# Patient Record
Sex: Female | Born: 1965 | Race: Black or African American | Hispanic: No | Marital: Married | Smoking: Former smoker
Health system: Southern US, Community
[De-identification: ages and names within clinical notes are randomized; demographics above are authoritative.]

## PROBLEM LIST (undated history)

## (undated) DIAGNOSIS — E559 Vitamin D deficiency, unspecified: Secondary | ICD-10-CM

## (undated) DIAGNOSIS — D126 Benign neoplasm of colon, unspecified: Secondary | ICD-10-CM

## (undated) DIAGNOSIS — F4310 Post-traumatic stress disorder, unspecified: Secondary | ICD-10-CM

## (undated) DIAGNOSIS — M1990 Unspecified osteoarthritis, unspecified site: Secondary | ICD-10-CM

## (undated) DIAGNOSIS — K219 Gastro-esophageal reflux disease without esophagitis: Secondary | ICD-10-CM

## (undated) DIAGNOSIS — IMO0002 Reserved for concepts with insufficient information to code with codable children: Secondary | ICD-10-CM

## (undated) HISTORY — PX: DEEP NECK LYMPH NODE BIOPSY / EXCISION: SUR126

## (undated) HISTORY — DX: Reserved for concepts with insufficient information to code with codable children: IMO0002

## (undated) HISTORY — DX: Unspecified osteoarthritis, unspecified site: M19.90

## (undated) HISTORY — DX: Gastro-esophageal reflux disease without esophagitis: K21.9

## (undated) HISTORY — PX: TONSILLECTOMY: SUR1361

## (undated) HISTORY — PX: COLONOSCOPY: SHX174

## (undated) HISTORY — DX: Benign neoplasm of colon, unspecified: D12.6

## (undated) HISTORY — PX: FOOT SURGERY: SHX648

## (undated) HISTORY — DX: Vitamin D deficiency, unspecified: E55.9

---

## 1998-08-27 ENCOUNTER — Other Ambulatory Visit: Admission: RE | Admit: 1998-08-27 | Discharge: 1998-08-27 | Payer: Self-pay | Admitting: Obstetrics and Gynecology

## 1999-03-17 ENCOUNTER — Inpatient Hospital Stay (HOSPITAL_COMMUNITY): Admission: AD | Admit: 1999-03-17 | Discharge: 1999-03-19 | Payer: Self-pay | Admitting: *Deleted

## 1999-03-22 ENCOUNTER — Encounter: Admission: RE | Admit: 1999-03-22 | Discharge: 1999-06-20 | Payer: Self-pay | Admitting: Obstetrics & Gynecology

## 1999-08-12 ENCOUNTER — Other Ambulatory Visit: Admission: RE | Admit: 1999-08-12 | Discharge: 1999-08-12 | Payer: Self-pay | Admitting: *Deleted

## 2000-04-30 ENCOUNTER — Other Ambulatory Visit: Admission: RE | Admit: 2000-04-30 | Discharge: 2000-04-30 | Payer: Self-pay | Admitting: Obstetrics and Gynecology

## 2000-07-08 ENCOUNTER — Other Ambulatory Visit: Admission: RE | Admit: 2000-07-08 | Discharge: 2000-07-08 | Payer: Self-pay | Admitting: Obstetrics and Gynecology

## 2001-07-28 ENCOUNTER — Other Ambulatory Visit: Admission: RE | Admit: 2001-07-28 | Discharge: 2001-07-28 | Payer: Self-pay | Admitting: Obstetrics and Gynecology

## 2002-08-01 ENCOUNTER — Ambulatory Visit (HOSPITAL_COMMUNITY): Admission: RE | Admit: 2002-08-01 | Discharge: 2002-08-01 | Payer: Self-pay | Admitting: Obstetrics and Gynecology

## 2002-08-01 ENCOUNTER — Encounter: Payer: Self-pay | Admitting: Obstetrics and Gynecology

## 2002-08-02 ENCOUNTER — Other Ambulatory Visit: Admission: RE | Admit: 2002-08-02 | Discharge: 2002-08-02 | Payer: Self-pay | Admitting: Obstetrics and Gynecology

## 2003-01-10 ENCOUNTER — Inpatient Hospital Stay (HOSPITAL_COMMUNITY): Admission: AD | Admit: 2003-01-10 | Discharge: 2003-01-12 | Payer: Self-pay | Admitting: Obstetrics and Gynecology

## 2003-09-05 ENCOUNTER — Other Ambulatory Visit: Admission: RE | Admit: 2003-09-05 | Discharge: 2003-09-05 | Payer: Self-pay | Admitting: Obstetrics and Gynecology

## 2004-09-10 ENCOUNTER — Other Ambulatory Visit: Admission: RE | Admit: 2004-09-10 | Discharge: 2004-09-10 | Payer: Self-pay | Admitting: Obstetrics and Gynecology

## 2005-09-18 ENCOUNTER — Other Ambulatory Visit: Admission: RE | Admit: 2005-09-18 | Discharge: 2005-09-18 | Payer: Self-pay | Admitting: Obstetrics and Gynecology

## 2006-06-19 ENCOUNTER — Emergency Department (HOSPITAL_COMMUNITY): Admission: EM | Admit: 2006-06-19 | Discharge: 2006-06-19 | Payer: Self-pay | Admitting: Emergency Medicine

## 2006-12-10 ENCOUNTER — Other Ambulatory Visit: Admission: RE | Admit: 2006-12-10 | Discharge: 2006-12-10 | Payer: Self-pay | Admitting: Otolaryngology

## 2006-12-23 ENCOUNTER — Other Ambulatory Visit: Admission: RE | Admit: 2006-12-23 | Discharge: 2006-12-23 | Payer: Self-pay | Admitting: Otolaryngology

## 2007-04-27 ENCOUNTER — Encounter: Admission: RE | Admit: 2007-04-27 | Discharge: 2007-04-27 | Payer: Self-pay | Admitting: Obstetrics and Gynecology

## 2007-05-11 ENCOUNTER — Encounter: Admission: RE | Admit: 2007-05-11 | Discharge: 2007-05-11 | Payer: Self-pay | Admitting: Obstetrics and Gynecology

## 2008-05-16 ENCOUNTER — Encounter: Admission: RE | Admit: 2008-05-16 | Discharge: 2008-05-16 | Payer: Self-pay | Admitting: Family Medicine

## 2008-10-19 ENCOUNTER — Encounter: Admission: RE | Admit: 2008-10-19 | Discharge: 2008-10-19 | Payer: Self-pay | Admitting: Obstetrics and Gynecology

## 2009-09-04 ENCOUNTER — Encounter: Admission: RE | Admit: 2009-09-04 | Discharge: 2009-09-04 | Payer: Self-pay | Admitting: Gastroenterology

## 2009-10-22 ENCOUNTER — Encounter: Admission: RE | Admit: 2009-10-22 | Discharge: 2009-10-22 | Payer: Self-pay | Admitting: Family Medicine

## 2010-05-05 DIAGNOSIS — IMO0002 Reserved for concepts with insufficient information to code with codable children: Secondary | ICD-10-CM

## 2010-05-05 HISTORY — PX: ESOPHAGOGASTRODUODENOSCOPY: SHX1529

## 2010-05-05 HISTORY — DX: Reserved for concepts with insufficient information to code with codable children: IMO0002

## 2010-09-20 NOTE — H&P (Signed)
[name], [name] NO.:  [id number]   MEDICAL RECORD NO.:  [id number]                   PATIENT TYPE:  INP   LOCATION:  9162                                 FACILITY:  [facility]   PHYSICIAN:  Osborn Coho, M.D.                DATE OF BIRTH:  1966-02-15   DATE OF ADMISSION:  01/10/2003  DATE OF DISCHARGE:                                HISTORY & PHYSICAL   HISTORY OF PRESENT ILLNESS:  [name] is a 45 year old married black female  gravida 5 para 1-0-3-1 at 61 and two-sevenths weeks who presents complaining  of uterine contractions every eight to ten minutes and reports decreased  fetal movement over the last day.  She denies any leaking or vaginal  bleeding.  She denies any nausea, vomiting, headaches, or visual  disturbances.  Her pregnancy has been followed at [facility] by  the certified nurse midwife service and has been essentially uncomplicated  though at risk originally for:  1. Partial previa now resolved.  2. Advanced maternal age with no amniocentesis.  3. Having cats at her home.  4. Multiple elective ABs.   Her group B strep is negative with this pregnancy.   OBSTETRICAL AND GYNECOLOGICAL HISTORY:  She is a gravida 5 para 1-0-3-1 who  had elective ABs in 1985, 1987, and 1992, and then delivered a viable female  infant in November 2000 that weighed 7 pounds 14 ounces at [redacted] weeks  gestation following a 17 hour labor vaginally with Stadol for pain medicine.  She did require Pitocin augmentation.  That child was named Martha [name] and was  delivered by Sherald Barge, C.N.M.  Other GYN history is noncontributory.   ALLERGIES:  No known drug allergies.   GENERAL MEDICAL HISTORY:  She reports having had the usual childhood  diseases.  She has no other medical issues other than occasional migraines  and that she smoked years ago but stopped in 1991.  Her only surgeries were  the elective ABs and on her feet in 1996 and 1999.  Only  other  hospitalization has been for childbirth.   GENETIC HISTORY:  Significant for the fact that she is age 29 and that she  has distant cousin with muscular dystrophy, and the father-of-the-baby's  mother had twins.   SOCIAL HISTORY:  She is married to Steward Ros who is involved and  supportive.  They are both employed and college-educated.  They are both  employed as Engineer, maintenance.  They are of the [place] faith.  They  deny any illicit drug use, alcohol, or smoking with this pregnancy.   PRENATAL LABORATORY DATA:  Her blood type is B positive, her antibody screen  is negative.  Syphilis is nonreactive.  Rubella is immune.  Hepatitis B  surface antigen is negative. HIV is nonreactive.  GC and chlamydia are both  negative.  Pap was within normal  limits.  Cystic fibrosis was negative.  Quad screen was within normal range.  One-hour Glucola was 107.  Her 36-week  beta strep was negative.   PHYSICAL EXAMINATION:  VITAL SIGNS:  Stable, she is afebrile.  HEENT:  Grossly within normal limits.  HEART:  Regular rhythm and rate.  CHEST:  Clear.  BREASTS:  Soft and nontender.  ABDOMEN:  Gravid with uterine contractions that are irregular and mild.  Her  fetal heart rate is reassuring by Doppler and she has a reactive NST.  PELVIC:  Cervix is 4 cm, 80%, vertex at a -1 with intact membranes.  EXTREMITIES:  Within normal limits.   ASSESSMENT:  1. Intrauterine pregnancy at 41 and two-sevenths weeks.  2. Very favorable cervix.  3. Early labor.  4. Negative group B strep.   PLAN:  Admit to labor and delivery at [facility] for early labor and  possible artificial rupture of membranes for augmentation of labor per Nigel Bridgeman C.N.M. who has received report on this patient.  Also will notify Dr.  Osborn Coho of the patient's admission.     Concha Pyo. Duplantis, C.N.M.              Osborn Coho, M.D.    SJD/MEDQ  D:  01/10/2003  T:  01/10/2003  Job:  [id number]

## 2010-09-20 NOTE — H&P (Signed)
[facility]  Patient:    Martha [name]                       MRN: [id number] Adm. Date:  [id number] Attending:  Pleas Koch Dictator:   Maggie Schwalbe, C.N.M.                         History and Physical  DATE OF BIRTH:                1965-10-01.  HISTORY OF PRESENT ILLNESS:   This is a 45 year old gravida 4, para 0-0-3-0, at [redacted] weeks gestation with spontaneous rupture of membranes at approximately 5:15 a.m. with complaints of strong contractions.  She has a negative group B strep history. Upon admission she was 1.5 cm, completely effaced, vertex at -1, positive pooling, positive nitrazine, positive fern.  Her prenatal course has been uncomplicated.   LABORATORY AND X-RAY DATA:    Prenatal labs at entry into the practice are as follows:  Hemoglobin is 9.7, hematocrit 31.8, blood type and Rh are B positive, Rh antibodies negative.  Toxoplasmosis screen is negative.  VDRL nonreactive. Rubella titer positive.  Hepatitis B surface antigen negative.  Gonorrhea and Chlamydia  cultures negative.  Pap smears have been normal in the past.  Glucola challenge  test is within normal limits.  Maternal alpha-fetoprotein within normal limits.   ALLERGIES:                    No known drug allergies.  PAST MEDICAL HISTORY:         History of gonorrhea, last episode in 1988 with treatment.  Foot surgery with pins removed in 1999.  FAMILY HISTORY:               Paternal grandfather with tuberculosis, uncle with stroke and brain tumor, maternal grandmother with hypertension, maternal grandmother with non-insulin-dependent diabetes.  Genetic history:  Maternal uncles with muscular dystrophy.  OBSTETRICAL HISTORY:          1985 - termination in the first trimester without  complications.  50 - termination in the first trimester without complications. 1992 - termination within the first trimester of twins without complications. his is  her fourth pregnancy.  SOCIAL HISTORY:               African-American, Protestant religion, single. Father of the baby is Panayiota Larkin; he is present and supportive.  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  Father of the baby is also a Designer, industrial/product, Engineer, maintenance (IT), works full time.  Father of the baby is a smoker and the patient was exposed to secondhand smoke.  Stable monogamous relationship. Denies smoking, alcohol, or drug abuse.  PHYSICAL EXAMINATION:  HEENT:                        Within normal limits.  CHEST:                        Lungs bilaterally clear.  CARDIOVASCULAR:               Regular rate and rhythm.  ABDOMEN:                      Soft, nontender.  Contractions every two to three  minutes, moderately strong.  Fetal heart rate is reactive and reassuring. Cervix is 1.5 cm, completely effaced, vertex at -1, positive pooling, positive nitrazine, positive fern.  EXTREMITIES:                  Trace edema, DTRs +1.  ASSESSMENT:                   1. Primigravida at term.                               2. Negative group Beta strep.                               3. Rupture of membranes.                               4. Early labor.  PLAN:                         Admit to labor and delivery.  Plan for CNM management.  Notify Dr. Elliot Gault of admission and status.  Routine [facility] orders.  Anticipate normal spontaneous vaginal delivery. DD:  03/17/99 TD:  03/17/99 Job: [id number]

## 2010-09-23 ENCOUNTER — Other Ambulatory Visit: Payer: Self-pay | Admitting: Obstetrics and Gynecology

## 2010-09-23 DIAGNOSIS — Z1231 Encounter for screening mammogram for malignant neoplasm of breast: Secondary | ICD-10-CM

## 2010-11-04 ENCOUNTER — Ambulatory Visit
Admission: RE | Admit: 2010-11-04 | Discharge: 2010-11-04 | Disposition: A | Discharge status: Home / Self Care | Payer: 59 | Source: Ambulatory Visit | Attending: Obstetrics and Gynecology | Admitting: Obstetrics and Gynecology

## 2010-11-04 DIAGNOSIS — Z1231 Encounter for screening mammogram for malignant neoplasm of breast: Secondary | ICD-10-CM

## 2012-01-08 ENCOUNTER — Ambulatory Visit (INDEPENDENT_AMBULATORY_CARE_PROVIDER_SITE_OTHER): Payer: 59 | Admitting: Family Medicine

## 2012-01-08 ENCOUNTER — Ambulatory Visit: Payer: 59

## 2012-01-08 VITALS — BP 104/60 | HR 68 | Temp 98.2°F | Resp 16 | Ht 65.0 in | Wt 169.0 lb

## 2012-01-08 DIAGNOSIS — S335XXA Sprain of ligaments of lumbar spine, initial encounter: Secondary | ICD-10-CM

## 2012-01-08 DIAGNOSIS — S39012A Strain of muscle, fascia and tendon of lower back, initial encounter: Secondary | ICD-10-CM

## 2012-01-08 DIAGNOSIS — M549 Dorsalgia, unspecified: Secondary | ICD-10-CM

## 2012-01-08 MED ORDER — CYCLOBENZAPRINE HCL 5 MG PO TABS: 5.0000 mg | ORAL_TABLET | Freq: Three times a day (TID) | ORAL | Status: AC | PRN

## 2012-01-08 MED ORDER — IBUPROFEN 600 MG PO TABS: 600.0000 mg | ORAL_TABLET | Freq: Three times a day (TID) | ORAL | Status: AC | PRN

## 2012-01-08 NOTE — Progress Notes (Signed)
Subjective:    Patient ID: Martha [name], female    DOB: Jul 09, 1965, 46 y.o.   MRN: [id number]  HPIThis 46 y.o. female presents for evaluation of low back pain.  While getting dressed this morning, turned to grab clothes with acute onset of lower back pain.  Took some Aleve 2 at 9:20 without improvement.  Lower back and radiating into knees.  Constant.  +tingling in legs thighs B and stops at knees; pain also stops at knees.  No saddle paresthesias.  Normal b/b function.  No history of low back pains.  No back surgeries.  Severity 8/10.  Almost cried with pain this morning.  Standing makes worse; going from sitting to standing.  Had to walk 400 yards; trying to maneuver was very difficult.  Yesterday, regular routine at work.  Team meetings, home. No urinary symptoms; no n/v.  History of peptic ulcer and took antibiotic x 2 weeks; s/p EGD.   No n/v/d/c; no fever.  LMP 12/24/11; husband with vasectomy.   Review of Systems  Constitutional: Negative for fever and chills.  Genitourinary: Negative for dysuria, urgency and hematuria.  Musculoskeletal: Positive for myalgias, back pain and arthralgias.  Neurological: Positive for numbness. Negative for weakness.    Past Medical History  Diagnosis Date  . Ulcer 05/05/2010    Peptic Ulcer/ H. Pylori +    Past Surgical History  Procedure Date  . Esophagogastroduodenoscopy 05/05/2010    +ulcer; +h. Pylori    Prior to Admission medications   Not on File    No Known Allergies  History   Social History  . Marital Status: Single    Spouse Name: N/A    Number of Children: N/A  . Years of Education: N/A   Occupational History  . Not on file.   Social History Main Topics  . Smoking status: Former Smoker    Quit date: 12/07/1989  . Smokeless tobacco: Not on file  . Alcohol Use: Not on file  . Drug Use: Not on file  . Sexually Active: Yes    Birth Control/ Protection: Surgical   Other Topics Concern  . Not on file   Social History  Narrative  . No narrative on file    No family history on file.     Objective:   Physical Exam  Nursing note and vitals reviewed. Constitutional: She is oriented to person, place, and time. She appears well-developed and well-nourished. No distress.  Musculoskeletal: She exhibits tenderness.       Lumbar back: She exhibits decreased range of motion, tenderness and pain. She exhibits no bony tenderness, no swelling, no edema, no deformity, no laceration, no spasm and normal pulse.       +decreased ROM diffusely lumbar spine due to pain; straight leg raises equivocal.  +TTP paraspinal muscles lumbar region B.  Toe and heel walking intact; marching intact.   Motor 5/5.  Neurological: She is alert and oriented to person, place, and time. She has normal reflexes. No cranial nerve deficit. She exhibits normal muscle tone.  Skin: Skin is warm and dry. No rash noted. She is not diaphoretic.  Psychiatric: She has a normal mood and affect. Her behavior is normal. Judgment and thought content normal.      [facility] reading (PRIMARY) by  Dr. Katrinka Blazing.  LS spine:  No acute findings.   Assessment & Plan:   1. Back pain  DG Lumbar Spine Complete  2. Lumbar strain  ibuprofen (ADVIL,MOTRIN) 600 MG tablet, cyclobenzaprine (FLEXERIL) 5 MG  tablet  3. History of PUD   1.  Lumbar pain/strain:  New onset today.  S/p xrays negative for acute process.  Rx for Ibuprofen 600mg  tid PRN, Flexeril 5mg  tid PRN.  Ice for first 48 hours and then switch to heat.  Home exercises provided to perform daily.  OOW note for 01/08/12 and 01/09/12.  Avoid repetitive twisting,rotating, bending for two weeks.  RTC for acute weakness, worsening numbness/tingling, b/b dysfunction.   2. History of PUD: new in past year; recommend taking Prilosec OTC one daily while taking NSAID.  Limit use of NSAIDS to 7-10 days.  Meds ordered this encounter  Medications  . ibuprofen (ADVIL,MOTRIN) 600 MG tablet    Sig: Take 1 tablet (600 mg total) by  mouth every 8 (eight) hours as needed for pain.    Dispense:  30 tablet    Refill:  0  . cyclobenzaprine (FLEXERIL) 5 MG tablet    Sig: Take 1 tablet (5 mg total) by mouth 3 (three) times daily as needed for muscle spasms.    Dispense:  30 tablet    Refill:  1

## 2012-01-08 NOTE — Progress Notes (Signed)
Reviewed and agree.

## 2012-01-08 NOTE — Patient Instructions (Addendum)
1. Back pain  DG Lumbar Spine Complete  2. Lumbar strain  ibuprofen (ADVIL,MOTRIN) 600 MG tablet, cyclobenzaprine (FLEXERIL) 5 MG tablet   Low Back Sprain with Rehab  A sprain is an injury in which a ligament is torn. The ligaments of the lower back are vulnerable to sprains. However, they are strong and require great force to be injured. These ligaments are important for stabilizing the spinal column. Sprains are classified into three categories. Grade 1 sprains cause pain, but the tendon is not lengthened. Grade 2 sprains include a lengthened ligament, due to the ligament being stretched or partially ruptured. With grade 2 sprains there is still function, although the function may be decreased. Grade 3 sprains involve a complete tear of the tendon or muscle, and function is usually impaired. SYMPTOMS   Severe pain in the lower back.   Sometimes, a feeling of a "pop," "snap," or tear, at the time of injury.   Tenderness and sometimes swelling at the injury site.   Uncommonly, bruising (contusion) within 48 hours of injury.   Muscle spasms in the back.  CAUSES  Low back sprains occur when a force is placed on the ligaments that is greater than they can handle. Common causes of injury include:  Performing a stressful act while off-balance.   Repetitive stressful activities that involve movement of the lower back.   Direct hit (trauma) to the lower back.  RISK INCREASES WITH:  Contact sports (football, wrestling).   Collisions (major skiing accidents).   Sports that require throwing or lifting (baseball, weightlifting).   Sports involving twisting of the spine (gymnastics, diving, tennis, golf).   Poor strength and flexibility.   Inadequate protection.   Previous back injury or surgery (especially fusion).  PREVENTION  Wear properly fitted and padded protective equipment.   Warm up and stretch properly before activity.   Allow for adequate recovery between workouts.    Maintain physical fitness:   Strength, flexibility, and endurance.   Cardiovascular fitness.   Maintain a healthy body weight.  PROGNOSIS  If treated properly, low back sprains usually heal with non-surgical treatment. The length of time for healing depends on the severity of the injury.  RELATED COMPLICATIONS   Recurring symptoms, resulting in a chronic problem.   Chronic inflammation and pain in the low back.   Delayed healing or resolution of symptoms, especially if activity is resumed too soon.   Prolonged impairment.   Unstable or arthritic joints of the low back.  TREATMENT  Treatment first involves the use of ice and medicine, to reduce pain and inflammation. The use of strengthening and stretching exercises may help reduce pain with activity. These exercises may be performed at home or with a therapist. Severe injuries may require referral to a therapist for further evaluation and treatment, such as ultrasound. Your caregiver may advise that you wear a back brace or corset, to help reduce pain and discomfort. Often, prolonged bed rest results in greater harm then benefit. Corticosteroid injections may be recommended. However, these should be reserved for the most serious cases. It is important to avoid using your back when lifting objects. At night, sleep on your back on a firm mattress, with a pillow placed under your knees. If non-surgical treatment is unsuccessful, surgery may be needed.  MEDICATION   If pain medicine is needed, nonsteroidal anti-inflammatory medicines (aspirin and ibuprofen), or other minor pain relievers (acetaminophen), are often advised.   Do not take pain medicine for 7 days before surgery.  Prescription pain relievers may be given, if your caregiver thinks they are needed. Use only as directed and only as much as you need.   Ointments applied to the skin may be helpful.   Corticosteroid injections may be given by your caregiver. These injections  should be reserved for the most serious cases, because they may only be given a certain number of times.  HEAT AND COLD  Cold treatment (icing) should be applied for 10 to 15 minutes every 2 to 3 hours for inflammation and pain, and immediately after activity that aggravates your symptoms. Use ice packs or an ice massage.   Heat treatment may be used before performing stretching and strengthening activities prescribed by your caregiver, physical therapist, or athletic trainer. Use a heat pack or a warm water soak.  SEEK MEDICAL CARE IF:   Symptoms get worse or do not improve in 2 to 4 weeks, despite treatment.   You develop numbness or weakness in either leg.   You lose bowel or bladder function.   Any of the following occur after surgery: fever, increased pain, swelling, redness, drainage of fluids, or bleeding in the affected area.   New, unexplained symptoms develop. (Drugs used in treatment may produce side effects.)  EXERCISES  RANGE OF MOTION (ROM) AND STRETCHING EXERCISES - Low Back Sprain Most people with lower back pain will find that their symptoms get worse with excessive bending forward (flexion) or arching at the lower back (extension). The exercises that will help resolve your symptoms will focus on the opposite motion.  Your physician, physical therapist or athletic trainer will help you determine which exercises will be most helpful to resolve your lower back pain. Do not complete any exercises without first consulting with your caregiver. Discontinue any exercises which make your symptoms worse, until you speak to your caregiver. If you have pain, numbness or tingling which travels down into your buttocks, leg or foot, the goal of the therapy is for these symptoms to move closer to your back and eventually resolve. Sometimes, these leg symptoms will get better, but your lower back pain may worsen. This is often an indication of progress in your rehabilitation. Be very alert to  any changes in your symptoms and the activities in which you participated in the 24 hours prior to the change. Sharing this information with your caregiver will allow him or her to most efficiently treat your condition. These exercises may help you when beginning to rehabilitate your injury. Your symptoms may resolve with or without further involvement from your physician, physical therapist or athletic trainer. While completing these exercises, remember:   Restoring tissue flexibility helps normal motion to return to the joints. This allows healthier, less painful movement and activity.   An effective stretch should be held for at least 30 seconds.   A stretch should never be painful. You should only feel a gentle lengthening or release in the stretched tissue.  FLEXION RANGE OF MOTION AND STRETCHING EXERCISES: STRETCH - Flexion, Single Knee to Chest   Lie on a firm bed or floor with both legs extended in front of you.   Keeping one leg in contact with the floor, bring your opposite knee to your chest. Hold your leg in place by either grabbing behind your thigh or at your knee.   Pull until you feel a gentle stretch in your low back. Hold __________ seconds.   Slowly release your grasp and repeat the exercise with the opposite side.  Repeat __________  times. Complete this exercise __________ times per day.  STRETCH - Flexion, Double Knee to Chest  Lie on a firm bed or floor with both legs extended in front of you.   Keeping one leg in contact with the floor, bring your opposite knee to your chest.   Tense your stomach muscles to support your back and then lift your other knee to your chest. Hold your legs in place by either grabbing behind your thighs or at your knees.   Pull both knees toward your chest until you feel a gentle stretch in your low back. Hold __________ seconds.   Tense your stomach muscles and slowly return one leg at a time to the floor.  Repeat __________ times.  Complete this exercise __________ times per day.  STRETCH - Low Trunk Rotation  Lie on a firm bed or floor. Keeping your legs in front of you, bend your knees so they are both pointed toward the ceiling and your feet are flat on the floor.   Extend your arms out to the side. This will stabilize your upper body by keeping your shoulders in contact with the floor.   Gently and slowly drop both knees together to one side until you feel a gentle stretch in your low back. Hold for __________ seconds.   Tense your stomach muscles to support your lower back as you bring your knees back to the starting position. Repeat the exercise to the other side.  Repeat __________ times. Complete this exercise __________ times per day  EXTENSION RANGE OF MOTION AND FLEXIBILITY EXERCISES: STRETCH - Extension, Prone on Elbows   Lie on your stomach on the floor, a bed will be too soft. Place your palms about shoulder width apart and at the height of your head.   Place your elbows under your shoulders. If this is too painful, stack pillows under your chest.   Allow your body to relax so that your hips drop lower and make contact more completely with the floor.   Hold this position for __________ seconds.   Slowly return to lying flat on the floor.  Repeat __________ times. Complete this exercise __________ times per day.  RANGE OF MOTION - Extension, Prone Press Ups  Lie on your stomach on the floor, a bed will be too soft. Place your palms about shoulder width apart and at the height of your head.   Keeping your back as relaxed as possible, slowly straighten your elbows while keeping your hips on the floor. You may adjust the placement of your hands to maximize your comfort. As you gain motion, your hands will come more underneath your shoulders.   Hold this position __________ seconds.   Slowly return to lying flat on the floor.  Repeat __________ times. Complete this exercise __________ times per day.    RANGE OF MOTION- Quadruped, Neutral Spine   Assume a hands and knees position on a firm surface. Keep your hands under your shoulders and your knees under your hips. You may place padding under your knees for comfort.   Drop your head and point your tailbone toward the ground below you. This will round out your lower back like an angry cat. Hold this position for __________ seconds.   Slowly lift your head and release your tail bone so that your back sags into a large arch, like an old horse.   Hold this position for __________ seconds.   Repeat this until you feel limber in your low back.  Now, find your "sweet spot." This will be the most comfortable position somewhere between the two previous positions. This is your neutral spine. Once you have found this position, tense your stomach muscles to support your low back.   Hold this position for __________ seconds.  Repeat __________ times. Complete this exercise __________ times per day.  STRENGTHENING EXERCISES - Low Back Sprain These exercises may help you when beginning to rehabilitate your injury. These exercises should be done near your "sweet spot." This is the neutral, low-back arch, somewhere between fully rounded and fully arched, that is your least painful position. When performed in this safe range of motion, these exercises can be used for people who have either a flexion or extension based injury. These exercises may resolve your symptoms with or without further involvement from your physician, physical therapist or athletic trainer. While completing these exercises, remember:   Muscles can gain both the endurance and the strength needed for everyday activities through controlled exercises.   Complete these exercises as instructed by your physician, physical therapist or athletic trainer. Increase the resistance and repetitions only as guided.   You may experience muscle soreness or fatigue, but the pain or discomfort you are  trying to eliminate should never worsen during these exercises. If this pain does worsen, stop and make certain you are following the directions exactly. If the pain is still present after adjustments, discontinue the exercise until you can discuss the trouble with your caregiver.  STRENGTHENING - Deep Abdominals, Pelvic Tilt   Lie on a firm bed or floor. Keeping your legs in front of you, bend your knees so they are both pointed toward the ceiling and your feet are flat on the floor.   Tense your lower abdominal muscles to press your low back into the floor. This motion will rotate your pelvis so that your tail bone is scooping upwards rather than pointing at your feet or into the floor.  With a gentle tension and even breathing, hold this position for __________ seconds. Repeat __________ times. Complete this exercise __________ times per day.  STRENGTHENING - Abdominals, Crunches   Lie on a firm bed or floor. Keeping your legs in front of you, bend your knees so they are both pointed toward the ceiling and your feet are flat on the floor. Cross your arms over your chest.   Slightly tip your chin down without bending your neck.   Tense your abdominals and slowly lift your trunk high enough to just clear your shoulder blades. Lifting higher can put excessive stress on the lower back and does not further strengthen your abdominal muscles.   Control your return to the starting position.  Repeat __________ times. Complete this exercise __________ times per day.  STRENGTHENING - Quadruped, Opposite UE/LE Lift   Assume a hands and knees position on a firm surface. Keep your hands under your shoulders and your knees under your hips. You may place padding under your knees for comfort.   Find your neutral spine and gently tense your abdominal muscles so that you can maintain this position. Your shoulders and hips should form a rectangle that is parallel with the floor and is not twisted.   Keeping  your trunk steady, lift your right hand no higher than your shoulder and then your left leg no higher than your hip. Make sure you are not holding your breath. Hold this position for __________ seconds.   Continuing to keep your abdominal muscles tense and your back  steady, slowly return to your starting position. Repeat with the opposite arm and leg.  Repeat __________ times. Complete this exercise __________ times per day.  STRENGTHENING - Abdominals and Quadriceps, Straight Leg Raise   Lie on a firm bed or floor with both legs extended in front of you.   Keeping one leg in contact with the floor, bend the other knee so that your foot can rest flat on the floor.   Find your neutral spine, and tense your abdominal muscles to maintain your spinal position throughout the exercise.   Slowly lift your straight leg off the floor about 6 inches for a count of 15, making sure to not hold your breath.   Still keeping your neutral spine, slowly lower your leg all the way to the floor.  Repeat this exercise with each leg __________ times. Complete this exercise __________ times per day. POSTURE AND BODY MECHANICS CONSIDERATIONS - Low Back Sprain Keeping correct posture when sitting, standing or completing your activities will reduce the stress put on different body tissues, allowing injured tissues a chance to heal and limiting painful experiences. The following are general guidelines for improved posture. Your physician or physical therapist will provide you with any instructions specific to your needs. While reading these guidelines, remember:  The exercises prescribed by your provider will help you have the flexibility and strength to maintain correct postures.   The correct posture provides the best environment for your joints to work. All of your joints have less wear and tear when properly supported by a spine with good posture. This means you will experience a healthier, less painful body.    Correct posture must be practiced with all of your activities, especially prolonged sitting and standing. Correct posture is as important when doing repetitive low-stress activities (typing) as it is when doing a single heavy-load activity (lifting).  RESTING POSITIONS Consider which positions are most painful for you when choosing a resting position. If you have pain with flexion-based activities (sitting, bending, stooping, squatting), choose a position that allows you to rest in a less flexed posture. You would want to avoid curling into a fetal position on your side. If your pain worsens with extension-based activities (prolonged standing, working overhead), avoid resting in an extended position such as sleeping on your stomach. Most people will find more comfort when they rest with their spine in a more neutral position, neither too rounded nor too arched. Lying on a non-sagging bed on your side with a pillow between your knees, or on your back with a pillow under your knees will often provide some relief. Keep in mind, being in any one position for a prolonged period of time, no matter how correct your posture, can still lead to stiffness. PROPER SITTING POSTURE In order to minimize stress and discomfort on your spine, you must sit with correct posture. Sitting with good posture should be effortless for a healthy body. Returning to good posture is a gradual process. Many people can work toward this most comfortably by using various supports until they have the flexibility and strength to maintain this posture on their own. When sitting with proper posture, your ears will fall over your shoulders and your shoulders will fall over your hips. You should use the back of the chair to support your upper back. Your lower back will be in a neutral position, just slightly arched. You may place a small pillow or folded towel at the base of your lower back for  support.  When working at a desk, create an  environment that supports good, upright posture. Without extra support, muscles tire, which leads to excessive strain on joints and other tissues. Keep these recommendations in mind: CHAIR:  A chair should be able to slide under your desk when your back makes contact with the back of the chair. This allows you to work closely.   The chair's height should allow your eyes to be level with the upper part of your monitor and your hands to be slightly lower than your elbows.  BODY POSITION  Your feet should make contact with the floor. If this is not possible, use a foot rest.   Keep your ears over your shoulders. This will reduce stress on your neck and low back.  INCORRECT SITTING POSTURES  If you are feeling tired and unable to assume a healthy sitting posture, do not slouch or slump. This puts excessive strain on your back tissues, causing more damage and pain. Healthier options include:  Using more support, like a lumbar pillow.   Switching tasks to something that requires you to be upright or walking.   Talking a brief walk.   Lying down to rest in a neutral-spine position.  PROLONGED STANDING WHILE SLIGHTLY LEANING FORWARD  When completing a task that requires you to lean forward while standing in one place for a long time, place either foot up on a stationary 2-4 inch high object to help maintain the best posture. When both feet are on the ground, the lower back tends to lose its slight inward curve. If this curve flattens (or becomes too large), then the back and your other joints will experience too much stress, tire more quickly, and can cause pain. CORRECT STANDING POSTURES Proper standing posture should be assumed with all daily activities, even if they only take a few moments, like when brushing your teeth. As in sitting, your ears should fall over your shoulders and your shoulders should fall over your hips. You should keep a slight tension in your abdominal muscles to brace your  spine. Your tailbone should point down to the ground, not behind your body, resulting in an over-extended swayback posture.  INCORRECT STANDING POSTURES  Common incorrect standing postures include a forward head, locked knees and/or an excessive swayback. WALKING Walk with an upright posture. Your ears, shoulders and hips should all line-up. PROLONGED ACTIVITY IN A FLEXED POSITION When completing a task that requires you to bend forward at your waist or lean over a low surface, try to find a way to stabilize 3 out of 4 of your limbs. You can place a hand or elbow on your thigh or rest a knee on the surface you are reaching across. This will provide you more stability, so that your muscles do not tire as quickly. By keeping your knees relaxed, or slightly bent, you will also reduce stress across your lower back. CORRECT LIFTING TECHNIQUES DO :  Assume a wide stance. This will provide you more stability and the opportunity to get as close as possible to the object which you are lifting.   Tense your abdominals to brace your spine. Bend at the knees and hips. Keeping your back locked in a neutral-spine position, lift using your leg muscles. Lift with your legs, keeping your back straight.   Test the weight of unknown objects before attempting to lift them.   Try to keep your elbows locked down at your sides in order get the best strength from your shoulders  when carrying an object.   Always ask for help when lifting heavy or awkward objects.  INCORRECT LIFTING TECHNIQUES DO NOT:   Lock your knees when lifting, even if it is a small object.   Bend and twist. Pivot at your feet or move your feet when needing to change directions.   Assume that you can safely pick up even a paperclip without proper posture.  Document Released: 04/21/2005 Document Revised: 04/10/2011 Document Reviewed: 08/03/2008 [facility] Patient Information 2012 [place], [place].

## 2012-02-11 ENCOUNTER — Other Ambulatory Visit: Payer: Self-pay | Admitting: Obstetrics and Gynecology

## 2012-02-11 DIAGNOSIS — Z1231 Encounter for screening mammogram for malignant neoplasm of breast: Secondary | ICD-10-CM

## 2012-02-19 ENCOUNTER — Encounter: Payer: Self-pay | Admitting: Family Medicine

## 2012-02-19 ENCOUNTER — Ambulatory Visit: Payer: 59

## 2012-02-19 ENCOUNTER — Ambulatory Visit (INDEPENDENT_AMBULATORY_CARE_PROVIDER_SITE_OTHER): Payer: 59 | Admitting: Family Medicine

## 2012-02-19 VITALS — BP 105/69 | HR 78 | Temp 98.4°F | Resp 16 | Ht 65.0 in | Wt 172.4 lb

## 2012-02-19 DIAGNOSIS — M25512 Pain in left shoulder: Secondary | ICD-10-CM

## 2012-02-19 DIAGNOSIS — M7540 Impingement syndrome of unspecified shoulder: Secondary | ICD-10-CM

## 2012-02-19 MED ORDER — TRAMADOL HCL 50 MG PO TABS: 50.0000 mg | ORAL_TABLET | Freq: Three times a day (TID) | ORAL | Status: DC | PRN

## 2012-02-19 MED ORDER — MELOXICAM 15 MG PO TABS: 15.0000 mg | ORAL_TABLET | Freq: Every day | ORAL | Status: DC

## 2012-02-19 NOTE — Progress Notes (Signed)
  Subjective:    Patient ID: Martha [name], female    DOB: 04/01/1966, 46 y.o.   MRN: [id number]  HPI L shoulder pain began last wk, no known injury, no h/o prev problems, just gradually started.  L arm does seem numb and weak at times and radiaties up to neck and down arm.  Sometimes pain goes into left neck into ear, fingers numb but pain just goes down to elbow. Throbbing constantly. Tried aleve and ibupfron 600 w/o any relieve. Nothing in specific makes it worse, just hurts all the time.  Tried a flexeril - made her sleepy but didn't help. Pt is right-handed.  Past Medical History  Diagnosis Date  . Ulcer 05/05/2010    Peptic Ulcer/ H. Pylori +     Review of Systems  Constitutional: Positive for activity change. Negative for fever, chills and unexpected weight change.  HENT: Positive for ear pain, neck pain and neck stiffness. Negative for hearing loss, congestion, sore throat, rhinorrhea, trouble swallowing, voice change, sinus pressure and ear discharge.   Respiratory: Negative for shortness of breath.   Cardiovascular: Negative for chest pain.  Musculoskeletal: Positive for myalgias and arthralgias. Negative for back pain, joint swelling and gait problem.  Skin: Negative for color change and rash.  Neurological: Positive for weakness, numbness and headaches.  Hematological: Negative for adenopathy. Does not bruise/bleed easily.  Psychiatric/Behavioral: Positive for sleep disturbance.      BP 105/69  Pulse 78  Temp 98.4 F (36.9 C) (Oral)  Resp 16  Ht 5\' 5"  (1.651 m)  Wt 172 lb 6.4 oz (78.2 kg)  BMI 28.69 kg/m2  SpO2 100% Objective:   Physical Exam  Constitutional: She is oriented to person, place, and time. She appears well-developed and well-nourished. No distress.  HENT:  Head: Normocephalic and atraumatic.  Right Ear: External ear normal.  Eyes: Conjunctivae normal are normal. No scleral icterus.  Neck: Normal range of motion. Neck supple. No thyromegaly present.    Cardiovascular: Normal rate, regular rhythm and normal heart sounds.   Pulmonary/Chest: Effort normal and breath sounds normal. No respiratory distress.  Musculoskeletal:       Right shoulder: Normal.       Left shoulder: She exhibits decreased range of motion and pain. She exhibits no tenderness, no bony tenderness, no swelling, no effusion, no crepitus, no deformity, no spasm, normal pulse and normal strength.  Lymphadenopathy:    She has no cervical adenopathy.  Neurological: She is alert and oriented to person, place, and time.  Skin: Skin is warm and dry. She is not diaphoretic. No erythema.  Psychiatric: She has a normal mood and affect. Her behavior is normal.         [facility] reading (PRIMARY) by  Dr. Clelia Croft. No acute bony abnormality seen.  Assessment & Plan:   1. Left shoulder pain  DG Shoulder Left, Ambulatory referral to Physical Therapy  2. Shoulder impingement syndrome    Heat, gentle stretching, nsaid -> meloxicam, prn tramadol for pain. Refer to PT as suspect L shoulder impingement - hand out of home exercises given.

## 2012-02-19 NOTE — Patient Instructions (Addendum)
Impingement Syndrome, Rotator Cuff, Bursitis with Rehab Impingement syndrome is a condition that involves inflammation of the tendons of the rotator cuff and the subacromial bursa, that causes pain in the shoulder. The rotator cuff consists of four tendons and muscles that control much of the shoulder and upper arm function. The subacromial bursa is a fluid filled sac that helps reduce friction between the rotator cuff and one of the bones of the shoulder (acromion). Impingement syndrome is usually an overuse injury that causes swelling of the bursa (bursitis), swelling of the tendon (tendonitis), and/or a tear of the tendon (strain). Strains are classified into three categories. Grade 1 strains cause pain, but the tendon is not lengthened. Grade 2 strains include a lengthened ligament, due to the ligament being stretched or partially ruptured. With grade 2 strains there is still function, although the function may be decreased. Grade 3 strains include a complete tear of the tendon or muscle, and function is usually impaired. SYMPTOMS   Pain around the shoulder, often at the outer portion of the upper arm.  Pain that gets worse with shoulder function, especially when reaching overhead or lifting.  Sometimes, aching when not using the arm.  Pain that wakes you up at night.  Sometimes, tenderness, swelling, warmth, or redness over the affected area.  Loss of strength.  Limited motion of the shoulder, especially reaching behind the back (to the back pocket or to unhook bra) or across your body.  Crackling sound (crepitation) when moving the arm.  Biceps tendon pain and inflammation (in the front of the shoulder). Worse when bending the elbow or lifting. CAUSES  Impingement syndrome is often an overuse injury, in which chronic (repetitive) motions cause the tendons or bursa to become inflamed. A strain occurs when a force is paced on the tendon or muscle that is greater than it can withstand.  Common mechanisms of injury include: Stress from sudden increase in duration, frequency, or intensity of training.  Direct hit (trauma) to the shoulder.  Aging, erosion of the tendon with normal use.  Bony bump on shoulder (acromial spur). RISK INCREASES WITH:  Contact sports (football, wrestling, boxing).  Throwing sports (baseball, tennis, volleyball).  Weightlifting and bodybuilding.  Heavy labor.  Previous injury to the rotator cuff, including impingement.  Poor shoulder strength and flexibility.  Failure to warm up properly before activity.  Inadequate protective equipment.  Old age.  Bony bump on shoulder (acromial spur). PREVENTION   Warm up and stretch properly before activity.  Allow for adequate recovery between workouts.  Maintain physical fitness:  Strength, flexibility, and endurance.  Cardiovascular fitness.  Learn and use proper exercise technique. PROGNOSIS  If treated properly, impingement syndrome usually goes away within 6 weeks. Sometimes surgery is required.  RELATED COMPLICATIONS   Longer healing time if not properly treated, or if not given enough time to heal.  Recurring symptoms, that result in a chronic condition.  Shoulder stiffness, frozen shoulder, or loss of motion.  Rotator cuff tendon tear.  Recurring symptoms, especially if activity is resumed too soon, with overuse, with a direct blow, or when using poor technique. TREATMENT  Treatment first involves the use of ice and medicine, to reduce pain and inflammation. The use of strengthening and stretching exercises may help reduce pain with activity. These exercises may be performed at home or with a therapist. If non-surgical treatment is unsuccessful after more than 6 months, surgery may be advised. After surgery and rehabilitation, activity is usually possible in 3 months.    MEDICATION  If pain medicine is needed, nonsteroidal anti-inflammatory medicines (aspirin and  ibuprofen), or other minor pain relievers (acetaminophen), are often advised.  Do not take pain medicine for 7 days before surgery.  Prescription pain relievers may be given, if your caregiver thinks they are needed. Use only as directed and only as much as you need.  Corticosteroid injections may be given by your caregiver. These injections should be reserved for the most serious cases, because they may only be given a certain number of times. HEAT AND COLD  Cold treatment (icing) should be applied for 10 to 15 minutes every 2 to 3 hours for inflammation and pain, and immediately after activity that aggravates your symptoms. Use ice packs or an ice massage.  Heat treatment may be used before performing stretching and strengthening activities prescribed by your caregiver, physical therapist, or athletic trainer. Use a heat pack or a warm water soak. SEEK MEDICAL CARE IF:   Symptoms get worse or do not improve in 4 to 6 weeks, despite treatment.  New, unexplained symptoms develop. (Drugs used in treatment may produce side effects.) EXERCISES  RANGE OF MOTION (ROM) AND STRETCHING EXERCISES - Impingement Syndrome (Rotator Cuff  Tendinitis, Bursitis) These exercises may help you when beginning to rehabilitate your injury. Your symptoms may go away with or without further involvement from your physician, physical therapist or athletic trainer. While completing these exercises, remember:   Restoring tissue flexibility helps normal motion to return to the joints. This allows healthier, less painful movement and activity.  An effective stretch should be held for at least 30 seconds.  A stretch should never be painful. You should only feel a gentle lengthening or release in the stretched tissue. STRETCH  Flexion, Standing  Stand with good posture. With an underhand grip on your right / left hand, and an overhand grip on the opposite hand, grasp a broomstick or cane so that your hands are a little  more than shoulder width apart.  Keeping your right / left elbow straight and shoulder muscles relaxed, push the stick with your opposite hand, to raise your right / left arm in front of your body and then overhead. Raise your arm until you feel a stretch in your right / left shoulder, but before you have increased shoulder pain.  Try to avoid shrugging your right / left shoulder as your arm rises, by keeping your shoulder blade tucked down and toward your mid-back spine. Hold for __________ seconds.  Slowly return to the starting position. Repeat __________ times. Complete this exercise __________ times per day. STRETCH  Abduction, Supine  Lie on your back. With an underhand grip on your right / left hand and an overhand grip on the opposite hand, grasp a broomstick or cane so that your hands are a little more than shoulder width apart.  Keeping your right / left elbow straight and your shoulder muscles relaxed, push the stick with your opposite hand, to raise your right / left arm out to the side of your body and then overhead. Raise your arm until you feel a stretch in your right / left shoulder, but before you have increased shoulder pain.  Try to avoid shrugging your right / left shoulder as your arm rises, by keeping your shoulder blade tucked down and toward your mid-back spine. Hold for __________ seconds.  Slowly return to the starting position. Repeat __________ times. Complete this exercise __________ times per day. ROM  Flexion, Active-Assisted  Lie on your back.   You may bend your knees for comfort.  Grasp a broomstick or cane so your hands are about shoulder width apart. Your right / left hand should grip the end of the stick, so that your hand is positioned "thumbs-up," as if you were about to shake hands.  Using your healthy arm to lead, raise your right / left arm overhead, until you feel a gentle stretch in your shoulder. Hold for __________ seconds.  Use the stick to  assist in returning your right / left arm to its starting position. Repeat __________ times. Complete this exercise __________ times per day.  ROM - Internal Rotation, Supine   Lie on your back on a firm surface. Place your right / left elbow about 60 degrees away from your side. Elevate your elbow with a folded towel, so that the elbow and shoulder are the same height.  Using a broomstick or cane and your strong arm, pull your right / left hand toward your body until you feel a gentle stretch, but no increase in your shoulder pain. Keep your shoulder and elbow in place throughout the exercise.  Hold for __________ seconds. Slowly return to the starting position. Repeat __________ times. Complete this exercise __________ times per day. STRETCH - Internal Rotation  Place your right / left hand behind your back, palm up.  Throw a towel or belt over your opposite shoulder. Grasp the towel with your right / left hand.  While keeping an upright posture, gently pull up on the towel, until you feel a stretch in the front of your right / left shoulder.  Avoid shrugging your right / left shoulder as your arm rises, by keeping your shoulder blade tucked down and toward your mid-back spine.  Hold for __________ seconds. Release the stretch, by lowering your healthy hand. Repeat __________ times. Complete this exercise __________ times per day. ROM - Internal Rotation   Using an underhand grip, grasp a stick behind your back with both hands.  While standing upright with good posture, slide the stick up your back until you feel a mild stretch in the front of your shoulder.  Hold for __________ seconds. Slowly return to your starting position. Repeat __________ times. Complete this exercise __________ times per day.  STRETCH  Posterior Shoulder Capsule   Stand or sit with good posture. Grasp your right / left elbow and draw it across your chest, keeping it at the same height as your  shoulder.  Pull your elbow, so your upper arm comes in closer to your chest. Pull until you feel a gentle stretch in the back of your shoulder.  Hold for __________ seconds. Repeat __________ times. Complete this exercise __________ times per day. STRENGTHENING EXERCISES - Impingement Syndrome (Rotator Cuff Tendinitis, Bursitis) These exercises may help you when beginning to rehabilitate your injury. They may resolve your symptoms with or without further involvement from your physician, physical therapist or athletic trainer. While completing these exercises, remember:  Muscles can gain both the endurance and the strength needed for everyday activities through controlled exercises.  Complete these exercises as instructed by your physician, physical therapist or athletic trainer. Increase the resistance and repetitions only as guided.  You may experience muscle soreness or fatigue, but the pain or discomfort you are trying to eliminate should never worsen during these exercises. If this pain does get worse, stop and make sure you are following the directions exactly. If the pain is still present after adjustments, discontinue the exercise until you can discuss   the trouble with your clinician.  During your recovery, avoid activity or exercises which involve actions that place your injured hand or elbow above your head or behind your back or head. These positions stress the tissues which you are trying to heal. STRENGTH - Scapular Depression and Adduction   With good posture, sit on a firm chair. Support your arms in front of you, with pillows, arm rests, or on a table top. Have your elbows in line with the sides of your body.  Gently draw your shoulder blades down and toward your mid-back spine. Gradually increase the tension, without tensing the muscles along the top of your shoulders and the back of your neck.  Hold for __________ seconds. Slowly release the tension and relax your muscles  completely before starting the next repetition.  After you have practiced this exercise, remove the arm support and complete the exercise in standing as well as sitting position. Repeat __________ times. Complete this exercise __________ times per day.  STRENGTH - Shoulder Abductors, Isometric  With good posture, stand or sit about 4-6 inches from a wall, with your right / left side facing the wall.  Bend your right / left elbow. Gently press your right / left elbow into the wall. Increase the pressure gradually, until you are pressing as hard as you can, without shrugging your shoulder or increasing any shoulder discomfort.  Hold for __________ seconds.  Release the tension slowly. Relax your shoulder muscles completely before you begin the next repetition. Repeat __________ times. Complete this exercise __________ times per day.  STRENGTH - External Rotators, Isometric  Keep your right / left elbow at your side and bend it 90 degrees.  Step into a door frame so that the outside of your right / left wrist can press against the door frame without your upper arm leaving your side.  Gently press your right / left wrist into the door frame, as if you were trying to swing the back of your hand away from your stomach. Gradually increase the tension, until you are pressing as hard as you can, without shrugging your shoulder or increasing any shoulder discomfort.  Hold for __________ seconds.  Release the tension slowly. Relax your shoulder muscles completely before you begin the next repetition. Repeat __________ times. Complete this exercise __________ times per day.  STRENGTH - Supraspinatus   Stand or sit with good posture. Grasp a __________ weight, or an exercise band or tubing, so that your hand is "thumbs-up," like you are shaking hands.  Slowly lift your right / left arm in a "V" away from your thigh, diagonally into the space between your side and straight ahead. Lift your hand to  shoulder height or as far as you can, without increasing any shoulder pain. At first, many people do not lift their hands above shoulder height.  Avoid shrugging your right / left shoulder as your arm rises, by keeping your shoulder blade tucked down and toward your mid-back spine.  Hold for __________ seconds. Control the descent of your hand, as you slowly return to your starting position. Repeat __________ times. Complete this exercise __________ times per day.  STRENGTH - External Rotators  Secure a rubber exercise band or tubing to a fixed object (table, pole) so that it is at the same height as your right / left elbow when you are standing or sitting on a firm surface.  Stand or sit so that the secured exercise band is at your uninjured side.  Bend   your right / left elbow 90 degrees. Place a folded towel or small pillow under your right / left arm, so that your elbow is a few inches away from your side.  Keeping the tension on the exercise band, pull it away from your body, as if pivoting on your elbow. Be sure to keep your body steady, so that the movement is coming only from your rotating shoulder.  Hold for __________ seconds. Release the tension in a controlled manner, as you return to the starting position. Repeat __________ times. Complete this exercise __________ times per day.  STRENGTH - Internal Rotators   Secure a rubber exercise band or tubing to a fixed object (table, pole) so that it is at the same height as your right / left elbow when you are standing or sitting on a firm surface.  Stand or sit so that the secured exercise band is at your right / left side.  Bend your elbow 90 degrees. Place a folded towel or small pillow under your right / left arm so that your elbow is a few inches away from your side.  Keeping the tension on the exercise band, pull it across your body, toward your stomach. Be sure to keep your body steady, so that the movement is coming only from  your rotating shoulder.  Hold for __________ seconds. Release the tension in a controlled manner, as you return to the starting position. Repeat __________ times. Complete this exercise __________ times per day.  STRENGTH - Scapular Protractors, Standing   Stand arms length away from a wall. Place your hands on the wall, keeping your elbows straight.  Begin by dropping your shoulder blades down and toward your mid-back spine.  To strengthen your protractors, keep your shoulder blades down, but slide them forward on your rib cage. It will feel as if you are lifting the back of your rib cage away from the wall. This is a subtle motion and can be challenging to complete. Ask your caregiver for further instruction, if you are not sure you are doing the exercise correctly.  Hold for __________ seconds. Slowly return to the starting position, resting the muscles completely before starting the next repetition. Repeat __________ times. Complete this exercise __________ times per day. STRENGTH - Scapular Protractors, Supine  Lie on your back on a firm surface. Extend your right / left arm straight into the air while holding a __________ weight in your hand.  Keeping your head and back in place, lift your shoulder off the floor.  Hold for __________ seconds. Slowly return to the starting position, and allow your muscles to relax completely before starting the next repetition. Repeat __________ times. Complete this exercise __________ times per day. STRENGTH - Scapular Protractors, Quadruped  Get onto your hands and knees, with your shoulders directly over your hands (or as close as you can be, comfortably).  Keeping your elbows locked, lift the back of your rib cage up into your shoulder blades, so your mid-back rounds out. Keep your neck muscles relaxed.  Hold this position for __________ seconds. Slowly return to the starting position and allow your muscles to relax completely before starting the  next repetition. Repeat __________ times. Complete this exercise __________ times per day.  STRENGTH - Scapular Retractors  Secure a rubber exercise band or tubing to a fixed object (table, pole), so that it is at the height of your shoulders when you are either standing, or sitting on a firm armless chair.  With a   palm down grip, grasp an end of the band in each hand. Straighten your elbows and lift your hands straight in front of you, at shoulder height. Step back, away from the secured end of the band, until it becomes tense.  Squeezing your shoulder blades together, draw your elbows back toward your sides, as you bend them. Keep your upper arms lifted away from your body throughout the exercise.  Hold for __________ seconds. Slowly ease the tension on the band, as you reverse the directions and return to the starting position. Repeat __________ times. Complete this exercise __________ times per day. STRENGTH - Shoulder Extensors   Secure a rubber exercise band or tubing to a fixed object (table, pole) so that it is at the height of your shoulders when you are either standing, or sitting on a firm armless chair.  With a thumbs-up grip, grasp an end of the band in each hand. Straighten your elbows and lift your hands straight in front of you, at shoulder height. Step back, away from the secured end of the band, until it becomes tense.  Squeezing your shoulder blades together, pull your hands down to the sides of your thighs. Do not allow your hands to go behind you.  Hold for __________ seconds. Slowly ease the tension on the band, as you reverse the directions and return to the starting position. Repeat __________ times. Complete this exercise __________ times per day.  STRENGTH - Scapular Retractors and External Rotators   Secure a rubber exercise band or tubing to a fixed object (table, pole) so that it is at the height as your shoulders, when you are either standing, or sitting on a  firm armless chair.  With a palm down grip, grasp an end of the band in each hand. Bend your elbows 90 degrees and lift your elbows to shoulder height, at your sides. Step back, away from the secured end of the band, until it becomes tense.  Squeezing your shoulder blades together, rotate your shoulders so that your upper arms and elbows remain stationary, but your fists travel upward to head height.  Hold for __________ seconds. Slowly ease the tension on the band, as you reverse the directions and return to the starting position. Repeat __________ times. Complete this exercise __________ times per day.  STRENGTH - Scapular Retractors and External Rotators, Rowing   Secure a rubber exercise band or tubing to a fixed object (table, pole) so that it is at the height of your shoulders, when you are either standing, or sitting on a firm armless chair.  With a palm down grip, grasp an end of the band in each hand. Straighten your elbows and lift your hands straight in front of you, at shoulder height. Step back, away from the secured end of the band, until it becomes tense.  Step 1: Squeeze your shoulder blades together. Bending your elbows, draw your hands to your chest, as if you are rowing a boat. At the end of this motion, your hands and elbow should be at shoulder height and your elbows should be out to your sides.  Step 2: Rotate your shoulders, to raise your hands above your head. Your forearms should be vertical and your upper arms should be horizontal.  Hold for __________ seconds. Slowly ease the tension on the band, as you reverse the directions and return to the starting position. Repeat __________ times. Complete this exercise __________ times per day.  STRENGTH  Scapular Depressors  Find a sturdy chair   without wheels, such as a dining room chair.  Keeping your feet on the floor, and your hands on the chair arms, lift your bottom up from the seat, and lock your elbows.  Keeping  your elbows straight, allow gravity to pull your body weight down. Your shoulders will rise toward your ears.  Raise your body against gravity by drawing your shoulder blades down your back, shortening the distance between your shoulders and ears. Although your feet should always maintain contact with the floor, your feet should progressively support less body weight, as you get stronger.  Hold for __________ seconds. In a controlled and slow manner, lower your body weight to begin the next repetition. Repeat __________ times. Complete this exercise __________ times per day.  Document Released: 04/21/2005 Document Revised: 07/14/2011 Document Reviewed: 08/03/2008 [employer] Patient Information 2013 [employer]  

## 2012-03-09 ENCOUNTER — Ambulatory Visit
Admission: RE | Admit: 2012-03-09 | Discharge: 2012-03-09 | Disposition: A | Discharge status: Home / Self Care | Payer: 59 | Source: Ambulatory Visit | Attending: Obstetrics and Gynecology | Admitting: Obstetrics and Gynecology

## 2012-03-09 DIAGNOSIS — Z1231 Encounter for screening mammogram for malignant neoplasm of breast: Secondary | ICD-10-CM

## 2012-03-11 ENCOUNTER — Other Ambulatory Visit: Payer: Self-pay | Admitting: Obstetrics and Gynecology

## 2012-03-11 DIAGNOSIS — R928 Other abnormal and inconclusive findings on diagnostic imaging of breast: Secondary | ICD-10-CM

## 2012-03-19 ENCOUNTER — Ambulatory Visit
Admission: RE | Admit: 2012-03-19 | Discharge: 2012-03-19 | Disposition: A | Discharge status: Home / Self Care | Payer: 59 | Source: Ambulatory Visit | Attending: Obstetrics and Gynecology | Admitting: Obstetrics and Gynecology

## 2012-03-19 ENCOUNTER — Ambulatory Visit (INDEPENDENT_AMBULATORY_CARE_PROVIDER_SITE_OTHER): Payer: 59 | Admitting: Family Medicine

## 2012-03-19 ENCOUNTER — Encounter: Payer: Self-pay | Admitting: Family Medicine

## 2012-03-19 VITALS — BP 104/68 | HR 70 | Temp 98.2°F | Resp 16 | Ht 65.0 in | Wt 173.0 lb

## 2012-03-19 DIAGNOSIS — R928 Other abnormal and inconclusive findings on diagnostic imaging of breast: Secondary | ICD-10-CM

## 2012-03-19 DIAGNOSIS — Z569 Unspecified problems related to employment: Secondary | ICD-10-CM

## 2012-03-19 DIAGNOSIS — M2550 Pain in unspecified joint: Secondary | ICD-10-CM

## 2012-03-19 DIAGNOSIS — E559 Vitamin D deficiency, unspecified: Secondary | ICD-10-CM

## 2012-03-19 DIAGNOSIS — Z566 Other physical and mental strain related to work: Secondary | ICD-10-CM

## 2012-03-19 DIAGNOSIS — Z0000 Encounter for general adult medical examination without abnormal findings: Secondary | ICD-10-CM

## 2012-03-19 DIAGNOSIS — R002 Palpitations: Secondary | ICD-10-CM

## 2012-03-19 LAB — POCT URINALYSIS DIPSTICK
Bilirubin, UA: NEGATIVE
Blood, UA: NEGATIVE
Glucose, UA: NEGATIVE
Nitrite, UA: NEGATIVE
pH, UA: 6

## 2012-03-19 LAB — T4, FREE: Free T4: 0.97 ng/dL (ref 0.80–1.80)

## 2012-03-19 LAB — BASIC METABOLIC PANEL
CO2: 26 mEq/L (ref 19–32)
Calcium: 9.3 mg/dL (ref 8.4–10.5)
Chloride: 104 mEq/L (ref 96–112)
Creat: 0.63 mg/dL (ref 0.50–1.10)
Glucose, Bld: 81 mg/dL (ref 70–99)
Sodium: 136 mEq/L (ref 135–145)

## 2012-03-19 NOTE — Progress Notes (Signed)
Subjective:    Patient ID: Martha [name], female    DOB: January 03, 1966, 46 y.o.   MRN: [id number]  HPI  This pleasant 46 y.o. AA female returns today for annual CPE. She continues to have joint pains  involving multiple joint. Left shoulder pain evaluated 02/19/12 at 102 [facility] by Dr. Clelia Croft did improve  w/o PT but acutely worse in last 2 days. Work is physically demanding and now working 10-12 hour  days. Current medications somewhat effective. Pt is fatigued due to work schedule; she is consider-  ing her options. She has been at this job x 15 years. Last vacation- July for 1 week. Company MD  evaluated her physical complaints and suggested that injuries were related to personal (not work-  related) activities.   HCM: UTD; GYN / MMG per [facility]   Review of Systems  Constitutional: Positive for appetite change and fatigue.  Respiratory: Positive for cough.   Cardiovascular: Positive for palpitations.  Musculoskeletal: Positive for back pain and arthralgias.  Neurological: Positive for headaches.  Psychiatric/Behavioral: Positive for sleep disturbance and agitation.       Objective:   Physical Exam  Nursing note and vitals reviewed. Constitutional: She is oriented to person, place, and time. She appears well-developed and well-nourished. No distress.  HENT:  Head: Normocephalic and atraumatic.  Right Ear: Hearing, tympanic membrane, external ear and ear canal normal.  Left Ear: Hearing, tympanic membrane, external ear and ear canal normal.  Nose: Nose normal. No mucosal edema, rhinorrhea, nasal deformity or septal deviation.  Mouth/Throat: Uvula is midline, oropharynx is clear and moist and mucous membranes are normal. No oral lesions. Normal dentition. No dental caries.  Eyes: Conjunctivae normal, EOM and lids are normal. Pupils are equal, round, and reactive to light. No scleral icterus.  Fundoscopic exam:      The right eye shows red reflex.      The left eye shows  red reflex. Neck: Normal range of motion. Neck supple. No thyromegaly present.  Cardiovascular: Normal rate, regular rhythm, normal heart sounds and intact distal pulses.  Exam reveals no gallop and no friction rub.   No murmur heard. Pulmonary/Chest: Effort normal and breath sounds normal. No respiratory distress. She exhibits no tenderness.  Abdominal: Soft. Normal appearance and bowel sounds are normal. She exhibits no distension, no pulsatile midline mass and no mass. There is no hepatosplenomegaly. There is no tenderness. There is no guarding and no CVA tenderness.  Genitourinary:       Deferred to GYN  Musculoskeletal: She exhibits no edema.       Right shoulder: Normal.       Left shoulder: She exhibits decreased range of motion, tenderness, pain, spasm and decreased strength. She exhibits no swelling, no effusion and no deformity.       Right elbow: Normal.      Left elbow: Normal.       Right wrist: She exhibits tenderness and swelling. She exhibits no effusion and no deformity.       Left wrist: Normal.       Right knee: Normal.       Left knee: Normal.       R hand has swelling, redness and tenderness over thenar eminence.   Lymphadenopathy:    She has no cervical adenopathy.  Neurological: She is alert and oriented to person, place, and time. She has normal reflexes. No cranial nerve deficit. She exhibits normal muscle tone. Coordination normal.  Skin: Skin is warm and  dry. No erythema.  Psychiatric: She has a normal mood and affect. Her behavior is normal. Judgment and thought content normal.    Results for orders placed in visit on 03/19/12  POCT URINALYSIS DIPSTICK      Component Value Range   Color, UA yellow     Clarity, UA clear     Glucose, UA neg     Bilirubin, UA neg     Ketones, UA neg     Spec Grav, UA 1.020     Blood, UA neg     pH, UA 6.0     Protein, UA neg     Urobilinogen, UA 0.2     Nitrite, UA neg     Leukocytes, UA Negative      ECG: NSR; no  ST-TW changes, no ectopy.      Assessment & Plan:   1. Routine general medical examination at a health care facility  POCT urinalysis dipstick  2. Palpitations   Basic metabolic panel, TSH, T4, Free  3. Multiple joint pain  Continue Meloxicam; get OTC topical analgesic  4. Unspecified vitamin D deficiency  Vitamin D, 25-hydroxy Resume OTC Vit D3  2000 IU  1 capsule daily  5. Stress at work - manifesting w/ various physical complaints Evaluate options and plan ways to reduce stress

## 2012-03-19 NOTE — Patient Instructions (Signed)
Arthralgia Your caregiver has diagnosed you as suffering from an arthralgia. Arthralgia means there is pain in a joint. This can come from many reasons including:  Bruising the joint which causes soreness (inflammation) in the joint.  Wear and tear on the joints which occur as we grow older (osteoarthritis).  Overusing the joint.  Various forms of arthritis.  Infections of the joint. Regardless of the cause of pain in your joint, most of these different pains respond to anti-inflammatory drugs and rest. The exception to this is when a joint is infected, and these cases are treated with antibiotics, if it is a bacterial infection. HOME CARE INSTRUCTIONS   Rest the injured area for as long as directed by your caregiver. Then slowly start using the joint as directed by your caregiver and as the pain allows. Crutches as directed may be useful if the ankles, knees or hips are involved. If the knee was splinted or casted, continue use and care as directed. If an stretchy or elastic wrapping bandage has been applied today, it should be removed and re-applied every 3 to 4 hours. It should not be applied tightly, but firmly enough to keep swelling down. Watch toes and feet for swelling, bluish discoloration, coldness, numbness or excessive pain. If any of these problems (symptoms) occur, remove the ace bandage and re-apply more loosely. If these symptoms persist, contact your caregiver or return to this location.  For the first 24 hours, keep the injured extremity elevated on pillows while lying down.  Apply ice for 15 to 20 minutes to the sore joint every couple hours while awake for the first half day. Then 3 to 4 times per day for the first 48 hours. Put the ice in a plastic bag and place a towel between the bag of ice and your skin.  Wear any splinting, casting, elastic bandage applications, or slings as instructed.  Only take over-the-counter or prescription medicines for pain, discomfort, or  fever as directed by your caregiver. Do not use aspirin immediately after the injury unless instructed by your physician. Aspirin can cause increased bleeding and bruising of the tissues.  If you were given crutches, continue to use them as instructed and do not resume weight bearing on the sore joint until instructed. Persistent pain and inability to use the sore joint as directed for more than 2 to 3 days are warning signs indicating that you should see a caregiver for a follow-up visit as soon as possible. Initially, a hairline fracture (break in bone) may not be evident on X-rays. Persistent pain and swelling indicate that further evaluation, non-weight bearing or use of the joint (use of crutches or slings as instructed), or further X-rays are indicated. X-rays may sometimes not show a small fracture until a week or 10 days later. Make a follow-up appointment with your own caregiver or one to whom we have referred you. A radiologist (specialist in reading X-rays) may read your X-rays. Make sure you know how you are to obtain your X-ray results. Do not assume everything is normal if you do not hear from us. SEEK MEDICAL CARE IF: Bruising, swelling, or pain increases. SEEK IMMEDIATE MEDICAL CARE IF:   Your fingers or toes are numb or blue.  The pain is not responding to medications and continues to stay the same or get worse.  The pain in your joint becomes severe.  You develop a fever over 102 F (38.9 C).  It becomes impossible to move or use the joint.   MAKE SURE YOU:   Understand these instructions.  Will watch your condition.  Will get help right away if you are not doing well or get worse. Document Released: 04/21/2005 Document Revised: 07/14/2011 Document Reviewed: 12/08/2007 [facility] Patient Information 2013 [place], [place].    I think your joint pain is work-related and probably an over-use syndrome. Continue taking the Meloxicam with for for joint pain; an OTC topical  analgesic (Arnica gel or Tiger Balm) can be applied twice a day for more pain relief Continue taking the Vit D for better bone and joint health; Immune system functioning, Cardiovascular health and mental well-being.

## 2012-03-22 ENCOUNTER — Other Ambulatory Visit: Payer: Self-pay | Admitting: Family Medicine

## 2012-03-22 MED ORDER — ERGOCALCIFEROL 1.25 MG (50000 UT) PO CAPS: 50000.0000 [IU] | ORAL_CAPSULE | ORAL | Status: AC

## 2012-03-22 NOTE — Progress Notes (Signed)
Quick Note:  Please call pt and advise that the following labs are abnormal... Vit D level is low; I have prescribed a once a week supplement and it has been routed to pharmacy on file.   Other labs are normal. ______

## 2012-03-23 ENCOUNTER — Encounter: Payer: Self-pay | Admitting: Family Medicine

## 2012-04-07 ENCOUNTER — Ambulatory Visit (INDEPENDENT_AMBULATORY_CARE_PROVIDER_SITE_OTHER): Payer: 59 | Admitting: Obstetrics and Gynecology

## 2012-04-07 ENCOUNTER — Encounter: Payer: Self-pay | Admitting: Obstetrics and Gynecology

## 2012-04-07 VITALS — BP 112/78 | Ht 65.0 in | Wt 173.0 lb

## 2012-04-07 DIAGNOSIS — Z124 Encounter for screening for malignant neoplasm of cervix: Secondary | ICD-10-CM

## 2012-04-07 NOTE — Progress Notes (Signed)
Last Pap: 10/14/2010 WNL: Yes Regular Periods:no comes 3-4 days early then lasts longer than 7 days Contraception:Husband Vasectomy Monthly Breast exam:yes Tetanus<51yrs:yes Nl.Bladder Function:yes Daily BMs:yes Healthy Diet:no Calcium:no Mammogram:yes Date of Mammogram: 03/19/2012 Normal Exercise:yes Have often Exercise: seldom Seatbelt: yes Abuse at home: no Stressful work:yes Sigmoid-colonoscopy: n/a  Bone Density: No PCP: na Change in PMH: no change Change in FMH:no change BP 112/78  Ht 5\' 5"  (1.651 m)  Wt 173 lb (78.472 kg)  BMI 28.79 kg/m2  LMP 03/26/2012 Pt with complaints:no Physical Examination: General appearance - alert, well appearing, and in no distress Mental status - normal mood, behavior, speech, dress, motor activity, and thought processes Neck - supple, no significant adenopathy,  thyroid exam: thyroid is normal in size without nodules or tenderness Chest - clear to auscultation, no wheezes, rales or rhonchi, symmetric air entry Heart - normal rate and regular rhythm Abdomen - soft, nontender, nondistended, no masses or organomegaly Breasts - breasts appear normal, no suspicious masses, no skin or nipple changes or axillary nodes Pelvic - normal external genitalia, vulva, vagina, cervix, uterus and adnexa Rectal - rectal exam not indicated Back exam - full range of motion, no tenderness, palpable spasm or pain on motion Neurological - alert, oriented, normal speech, no focal findings or movement disorder noted Musculoskeletal - no joint tenderness, deformity or swelling Extremities - no edema, redness or tenderness in the calves or thighs Skin - normal coloration and turgor, no rashes, no suspicious skin lesions noted Routine exam Pap sent yes Mammogram due no vasectomy used for contraception RT 1 yr Pt asked for pointers on how to achieve an orgasm.  She was given them.  Her periods are q 22-24 days for 6-7 days long.  She changes a pad or tampon q  3-4 hrs.  She has no pain with her menses

## 2012-04-08 LAB — PAP IG W/ RFLX HPV ASCU

## 2012-06-18 ENCOUNTER — Ambulatory Visit: Payer: Self-pay | Admitting: Family Medicine

## 2012-06-19 ENCOUNTER — Other Ambulatory Visit: Payer: Self-pay

## 2012-06-25 ENCOUNTER — Encounter: Payer: Self-pay | Admitting: Family Medicine

## 2012-06-25 ENCOUNTER — Ambulatory Visit (INDEPENDENT_AMBULATORY_CARE_PROVIDER_SITE_OTHER): Payer: 59 | Admitting: Family Medicine

## 2012-06-25 VITALS — BP 110/66 | HR 70 | Temp 97.8°F | Resp 16 | Ht 64.5 in | Wt 168.6 lb

## 2012-06-25 DIAGNOSIS — E559 Vitamin D deficiency, unspecified: Secondary | ICD-10-CM

## 2012-06-25 MED ORDER — CITALOPRAM HYDROBROMIDE 20 MG PO TABS: ORAL_TABLET | ORAL | Status: DC

## 2012-06-27 DIAGNOSIS — G5600 Carpal tunnel syndrome, unspecified upper limb: Secondary | ICD-10-CM | POA: Insufficient documentation

## 2012-06-27 NOTE — Progress Notes (Signed)
S:  Pt is 47 y.o. AA female who was diagnosed w/ Vit D deficiency as well as CTS of R wrist and hand. She is here to have Vitamin D level rechecked but then admitted that she has not been compliant w/ this medication.   CTS was evaluated by Dr. Prince Rome; pt has moderate- moderately severe disease, currently being treated conservatively. Pt is taking oral Prednisone and analgesic and wearing a splint, esp at night. Her work requires detailed tasks w/ hands and this is aggravating CTS; there are times when she has to remove the splint at work in order to accomplish her duties. The ongoing pain and other frustrations are becoming difficult to cope with. 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 Dr. Prince Rome has advised conservative treatment; surgery is last resort. She does not have a follow-up w/ ORTHO but was advised to give this plan 4-6 weeks then contact office. Pt thinks she will need to take leave from work in order to have a good chance of improvement. (She is familiar w/ FMLA and will contact HR about next steps to take).  Pt acknowledges chronic anxiety and agrees to medication; she was treated in the past for anxiety/ depression. She has irritability and sleep disturbance (some of this is due to pain); pt denies behavior changes, aggression, dysphoric mood or self harm or thoughts of harm to others. Her husband is supportive.  ROS: As per HPI.  O:  Filed Vitals:   06/25/12 1616  BP: 110/66  Pulse: 70  Temp: 97.8 F (36.6 C)  Resp: 16   GEN: In NAD: WN,WD. HENT: [place]/AT; EOMI w/ clear c onj/ sclerae. EACs normal. Oroph clear and moist. COR: RRR. LUNGS: Normal resp rate and effort. SKIN: W&D. MS: R wrist/ hand- atrophy of thenar eminence. No STS or deformity. NEURO: A&O x 3; CNs intact.  Mentation- flat affect; otherwise normal.   A/P:  Unspecified vitamin D deficiency- continue Vitamin D supplement  Work-related stress-  advised getting FMLA form completed by Dr. Prince Rome RX: Citalopram 20 mg 1/2 tablet every AM for 2 weeks then increase to 1 tablet every AM.  CTS (carpal tunnel syndrome), right    Follow-up w/ ORTHO.

## 2012-09-24 ENCOUNTER — Ambulatory Visit: Payer: Self-pay | Admitting: Family Medicine

## 2013-03-10 ENCOUNTER — Other Ambulatory Visit: Payer: Self-pay

## 2013-04-08 ENCOUNTER — Other Ambulatory Visit: Payer: Self-pay

## 2013-04-08 DIAGNOSIS — Z1231 Encounter for screening mammogram for malignant neoplasm of breast: Secondary | ICD-10-CM

## 2013-05-17 ENCOUNTER — Ambulatory Visit
Admission: RE | Admit: 2013-05-17 | Discharge: 2013-05-17 | Disposition: A | Discharge status: Home / Self Care | Payer: 59 | Source: Ambulatory Visit

## 2013-05-17 DIAGNOSIS — Z1231 Encounter for screening mammogram for malignant neoplasm of breast: Secondary | ICD-10-CM

## 2013-07-26 ENCOUNTER — Ambulatory Visit (INDEPENDENT_AMBULATORY_CARE_PROVIDER_SITE_OTHER): Payer: 59 | Admitting: Internal Medicine

## 2013-07-26 VITALS — BP 90/68 | HR 71 | Temp 98.6°F | Resp 20 | Ht 69.5 in | Wt 174.4 lb

## 2013-07-26 DIAGNOSIS — J019 Acute sinusitis, unspecified: Secondary | ICD-10-CM

## 2013-07-26 MED ORDER — AMOXICILLIN 500 MG PO CAPS: 1000.0000 mg | ORAL_CAPSULE | Freq: Two times a day (BID) | ORAL | Status: AC

## 2013-07-26 NOTE — Progress Notes (Signed)
   Subjective:    Patient ID: Martha [name], female    DOB: 04/18/1966, 48 y.o.   MRN: [id number]  HPI Started with sore throat and hoarseness on March 1 Progress 2 congestion with sneezing and coughing and fever and chills over the next 2 weeks Now has significant congestion and interferes with sleep and produces a yellow nasal discharge Cough and fever have resolved No history of allergies  Review of Systems Noncontributory    Objective:   Physical Exam  BP 90/68  Pulse 71  Temp(Src) 98.6 F (37 C)  Resp 20  Ht 5' 9.5" (1.765 m)  Wt 174 lb 6.4 oz (79.107 kg)  BMI 25.39 kg/m2  SpO2 91%  LMP 07/03/2013 TMs clear Nares boggy with purulent discharge Tender maxillary areas Throat clear  No nodes Lungs clear to auscultation      Assessment & Plan:  Acute sinusitis, unspecified  Meds ordered this encounter  Medications  . amoxicillin (AMOXIL) 500 MG capsule    Sig: Take 2 capsules (1,000 mg total) by mouth 2 (two) times daily.    Dispense:  40 capsule    Refill:  0   Sudafed Followup 2 weeks if not well

## 2014-02-10 ENCOUNTER — Encounter: Payer: Self-pay | Admitting: Family Medicine

## 2014-02-10 ENCOUNTER — Ambulatory Visit (INDEPENDENT_AMBULATORY_CARE_PROVIDER_SITE_OTHER): Payer: 59 | Admitting: Family Medicine

## 2014-02-10 VITALS — BP 105/71 | HR 74 | Temp 98.1°F | Resp 16 | Ht 65.0 in | Wt 176.8 lb

## 2014-02-10 DIAGNOSIS — R102 Pelvic and perineal pain: Secondary | ICD-10-CM

## 2014-02-10 DIAGNOSIS — Z0000 Encounter for general adult medical examination without abnormal findings: Secondary | ICD-10-CM

## 2014-02-10 DIAGNOSIS — E559 Vitamin D deficiency, unspecified: Secondary | ICD-10-CM

## 2014-02-10 DIAGNOSIS — Z1211 Encounter for screening for malignant neoplasm of colon: Secondary | ICD-10-CM

## 2014-02-10 LAB — LIPID PANEL
Cholesterol: 143 mg/dL (ref 0–200)
HDL: 41 mg/dL (ref >39)
LDL Cholesterol: 81 mg/dL (ref 0–99)
Total CHOL/HDL Ratio: 3.5 Ratio
Triglycerides: 107 mg/dL (ref <150)
VLDL: 21 mg/dL (ref 0–40)

## 2014-02-10 LAB — POCT URINALYSIS DIPSTICK
Bilirubin, UA: NEGATIVE
Blood, UA: NEGATIVE
GLUCOSE UA: NEGATIVE
KETONES UA: NEGATIVE
Leukocytes, UA: NEGATIVE
Nitrite, UA: NEGATIVE
PH UA: 8.5
Protein, UA: NEGATIVE
SPEC GRAV UA: 1.015
Urobilinogen, UA: 0.2

## 2014-02-10 LAB — CBC WITH DIFFERENTIAL/PLATELET
BASOS PCT: 1 % (ref 0–1)
Basophils Absolute: 0.1 10*3/uL (ref 0.0–0.1)
EOS ABS: 0.2 10*3/uL (ref 0.0–0.7)
Eosinophils Relative: 4 % (ref 0–5)
HCT: 36.5 % (ref 36.0–46.0)
Hemoglobin: 11.9 g/dL — ABNORMAL LOW (ref 12.0–15.0)
Lymphocytes Relative: 39 % (ref 12–46)
Lymphs Abs: 2.3 10*3/uL (ref 0.7–4.0)
MCH: 25.4 pg — AB (ref 26.0–34.0)
MCHC: 32.6 g/dL (ref 30.0–36.0)
MCV: 77.8 fL — AB (ref 78.0–100.0)
Monocytes Absolute: 0.4 10*3/uL (ref 0.1–1.0)
Monocytes Relative: 7 % (ref 3–12)
NEUTROS PCT: 49 % (ref 43–77)
Neutro Abs: 2.9 10*3/uL (ref 1.7–7.7)
PLATELETS: 336 10*3/uL (ref 150–400)
RBC: 4.69 MIL/uL (ref 3.87–5.11)
RDW: 13.9 % (ref 11.5–15.5)
WBC: 5.9 10*3/uL (ref 4.0–10.5)

## 2014-02-10 LAB — COMPLETE METABOLIC PANEL WITH GFR
ALK PHOS: 74 U/L (ref 39–117)
ALT: 9 U/L (ref 0–35)
AST: 18 U/L (ref 0–37)
Albumin: 4.1 g/dL (ref 3.5–5.2)
BILIRUBIN TOTAL: 0.7 mg/dL (ref 0.2–1.2)
BUN: 7 mg/dL (ref 6–23)
CO2: 23 mEq/L (ref 19–32)
Calcium: 9.4 mg/dL (ref 8.4–10.5)
Chloride: 101 mEq/L (ref 96–112)
Creat: 0.69 mg/dL (ref 0.50–1.10)
GFR, Est African American: >89 mL/min
GFR, Est Non African American: >89 mL/min
Glucose, Bld: 79 mg/dL (ref 70–99)
POTASSIUM: 4.1 meq/L (ref 3.5–5.3)
SODIUM: 138 meq/L (ref 135–145)
TOTAL PROTEIN: 7.5 g/dL (ref 6.0–8.3)

## 2014-02-10 LAB — POCT UA - MICROSCOPIC ONLY
CASTS, UR, LPF, POC: NEGATIVE
CRYSTALS, UR, HPF, POC: NEGATIVE
MUCUS UA: POSITIVE
WBC, Ur, HPF, POC: NEGATIVE
Yeast, UA: NEGATIVE

## 2014-02-10 NOTE — Progress Notes (Signed)
Subjective:    Patient ID: Martha [name], female    DOB: [birth year], 48 y.o.   MRN: [id number]  HPI  This 48 y.o. AA female presents for CPE. She is in good health w/ minor problems w/ LBP and morning stiffness.  Also c/o mild intermittent pelvic discomfort; often relieved w/ bowel movement. Pt has known Vit D def but is not taking OTC supplement; she completed the prescription Vitamin D.  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  HCM: PAP/ MM per GYN- Current.           IMM- Declines Flu vaccine.           Vision- Every 1-2 years.           Dental- current.   Patient Active Problem List   Diagnosis Date Noted  . CTS (carpal tunnel syndrome) 06/27/2012  . Unspecified vitamin D deficiency 03/19/2012  . Multiple joint pain 03/19/2012    History   Social History  . Marital Status: Married    Spouse Name: N/A    Number of Children: N/A  . Years of Education: N/A   Occupational History  . Not on file.   Social History Main Topics  . Smoking status: Former Smoker    Quit date: 12/07/1989  . Smokeless tobacco: Not on file  . Alcohol Use: Not on file  . Drug Use: Not on file  . Sexual Activity: Yes    Birth Control/ Protection: Surgical   Other Topics Concern  . Not on file   Social History Narrative   Marital status: married.   Children: 2 children (12, 8)   Employment: Retired from Engineer, manufacturing systems; works in Therapist, sports.          Family History  Problem Relation Age of Onset  . Anemia Mother   . Hypertension Father   . Hyperlipidemia Father   . Asthma Sister   . Hypertension Sister   . Hyperlipidemia Sister   . Multiple sclerosis Maternal Grandfather   . Gout Paternal Grandfather   . Liver disease Paternal Grandfather      Review of Systems  Constitutional: Positive for diaphoresis.       Night sweats- post menopausal.  HENT: Negative.   Eyes: Negative.   Respiratory:  Negative.   Cardiovascular: Negative.   Gastrointestinal: Positive for abdominal pain.       Intermittent cramping lower abd /pelvic pain. Stools are brown or green; no blood or melena.  Endocrine: Negative.   Genitourinary: Negative.   Musculoskeletal: Positive for back pain.       Thoracic spine pain in early morning upon awakening; subsides as day progresses.  Skin: Negative.   Allergic/Immunologic: Negative.   Neurological: Negative.   Hematological: Negative.   Psychiatric/Behavioral: Negative.        Objective:   Physical Exam  Nursing note and vitals reviewed. Constitutional: She is oriented to person, place, and time. Vital signs are normal. She appears well-developed and well-nourished. No distress.  HENT:  Head: Normocephalic and atraumatic.  Right Ear: Hearing, tympanic membrane, external ear and ear canal normal.  Left Ear: Hearing, tympanic membrane, external ear and ear canal normal.  Nose: Mucosal edema present. No nasal deformity or septal deviation. Right sinus exhibits no maxillary sinus tenderness and no frontal sinus tenderness. Left sinus exhibits no maxillary sinus tenderness and no frontal sinus tenderness.  Mouth/Throat: Uvula is midline, oropharynx is clear and  moist and mucous membranes are normal. No trismus in the jaw. Normal dentition. No uvula swelling or dental caries.  Eyes: Conjunctivae, EOM and lids are normal. No scleral icterus.  Fundoscopic exam:      The right eye shows no arteriolar narrowing, no AV nicking and no papilledema. The right eye shows red reflex.       The left eye shows no arteriolar narrowing, no AV nicking and no papilledema. The left eye shows red reflex.  Neck: Trachea normal, normal range of motion, full passive range of motion without pain and phonation normal. Neck supple. No spinous process tenderness and no muscular tenderness present. No mass and no thyromegaly present.  Cardiovascular: Normal rate, regular rhythm, S1  normal, S2 normal, normal heart sounds, intact distal pulses and normal pulses.   No extrasystoles are present. PMI is not displaced.  Exam reveals no gallop and no friction rub.   No murmur heard. Pulmonary/Chest: Effort normal and breath sounds normal. No respiratory distress. She has no decreased breath sounds.  Abdominal: Soft. Normal appearance, normal aorta and bowel sounds are normal. She exhibits no distension and no mass. There is no hepatosplenomegaly. There is no tenderness. There is no guarding and no CVA tenderness.  Genitourinary:  Deferred.  Musculoskeletal:       Cervical back: Normal.       Thoracic back: Normal.       Lumbar back: She exhibits tenderness and spasm. She exhibits normal range of motion, no bony tenderness, no swelling, no deformity and no pain.  Paraspinous muscle tenderness is minor; mild SI joint tenderness. Forward flexion > 60 degrees. Can walk on toes and heels. Can bear weight on each leg.  Remainder of exam unremarkable.  Lymphadenopathy:       Head (right side): No submental, no submandibular, no tonsillar, no preauricular, no posterior auricular and no occipital adenopathy present.       Head (left side): No submental, no submandibular, no tonsillar, no preauricular, no posterior auricular and no occipital adenopathy present.    She has no cervical adenopathy.    She has no axillary adenopathy.       Right: No inguinal and no supraclavicular adenopathy present.       Left: No inguinal and no supraclavicular adenopathy present.  Neurological: She is alert and oriented to person, place, and time. She has normal strength and normal reflexes. She displays no atrophy. No cranial nerve deficit or sensory deficit. She exhibits normal muscle tone. She displays a negative Romberg sign. Coordination and gait normal.  Skin: Skin is warm, dry and intact. No ecchymosis, no lesion and no rash noted. She is not diaphoretic. No cyanosis or erythema. Nails show no  clubbing.  Psychiatric: She has a normal mood and affect. Her speech is normal and behavior is normal. Judgment and thought content normal. Cognition and memory are normal.    01/08/2012  Xray: Lumbar Spine- Lower lumbar arthropathy >> Mild sclerosis of the apophyseal joints at L4-L5 and L5-S1. No evidence of spondylolisthesis. No significant foraminal stenosis on th oblique views.   Results for orders placed in visit on 02/10/14  POCT URINALYSIS DIPSTICK      Result Value Ref Range   Color, UA yellow     Clarity, UA clear     Glucose, UA neg     Bilirubin, UA neg     Ketones, UA neg     Spec Grav, UA 1.015     Blood, UA neg  pH, UA 8.5     Protein, UA neg     Urobilinogen, UA 0.2     Nitrite, UA neg     Leukocytes, UA Negative    POCT UA - MICROSCOPIC ONLY      Result Value Ref Range   WBC, Ur, HPF, POC neg     RBC, urine, microscopic 2-5     Bacteria, U Microscopic trace     Mucus, UA pos     Epithelial cells, urine per micros 0-1     Crystals, Ur, HPF, POC neg     Casts, Ur, LPF, POC neg     Yeast, UA neg         Assessment & Plan:  Routine general medical examination at a health care facility  Vitamin D deficiency - Get OTC Vitamin D3 and take supplement most days of the week. Plan: CBC with Differential, Lipid panel, Vit D  25 hydroxy (rtn osteoporosis monitoring), COMPLETE METABOLIC PANEL WITH GFR  Pelvic pain in female - Pt states this has been evaluated by GYN and no etiology found. 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  Suspect this is related to lumbar arthropathy also. Advised stretching exercises and core strengthening. Maintain good colon health with regular exercise and healthy diet. RTC prn worsening. Plan: POCT urinalysis dipstick, POCT UA - Microscopic Only, CBC with Differential  Screening for colon cancer - Plan: Hemoccult - 1 Card (office), Hemoccult - 1 Card (office), Hemoccult - 1 Card (office)

## 2014-02-10 NOTE — Patient Instructions (Addendum)
Keeping You Healthy  Get These Tests 1. Blood Pressure- Have your blood pressure checked once a year by your health care provider.  Normal blood pressure is 120/80. 2. Weight- Have your body mass index (BMI) calculated to screen for obesity.  BMI is measure of body fat based on height and weight.  You can also calculate your own BMI at GravelBags.it. 3. Cholesterol- Have your cholesterol checked every 5 years starting at age 47 then yearly starting at age 55. 67. Chlamydia, HIV, and other sexually transmitted diseases- Get screened every year until age 48, then within three months of each new sexual provider. 5. Pap Smear- Every 1-3 years; discuss with your health care provider. 6. Mammogram- Every year starting at age 69  Take these medicines  Calcium with Vitamin D-Your body needs 1200 mg of Calcium each day and [id number] IU of Vitamin D daily.  Your body can only absorb 500 mg of Calcium at a time so Calcium must be taken in 2 or 3 divided doses throughout the day.  Multivitamin with folic acid- Once daily if it is possible for you to become pregnant.  Get these Immunizations  Gardasil-Series of three doses; prevents HPV related illness such as genital warts and cervical cancer.  Menactra-Single dose; prevents meningitis.  Tetanus shot- Every 10 years.  Flu shot-Every year.  Take these steps 1. Do not smoke-Your healthcare provider can help you quit.  For tips on how to quit go to www.smokefree.gov or call 1-800 QUITNOW. 2. Be physically active- Exercise 5 days a week for at least 30 minutes.  If you are not already physically active, start slow and gradually work up to 30 minutes of moderate physical activity.  Examples of moderate activity include walking briskly, dancing, swimming, bicycling, etc. 3. Breast Cancer- A self breast exam every month is important for early detection of breast cancer.  For more information and instruction on self breast exams, ask your  healthcare provider or https://www.patel.info/. 4. Eat a healthy diet- Eat a variety of healthy foods such as fruits, vegetables, whole grains, low fat milk, low fat cheeses, yogurt, lean meats, poultry and fish, beans, nuts, tofu, etc.  For more information go to www. Thenutritionsource.org 5. Drink alcohol in moderation- Limit alcohol intake to one drink or less per day. Never drink and drive. 6. Depression- Your emotional health is as important as your physical health.  If you're feeling down or losing interest in things you normally enjoy please talk to your healthcare provider about being screened for depression. 7. Dental visit- Brush and floss your teeth twice daily; visit your dentist twice a year. 8. Eye doctor- Get an eye exam at least every 2 years. 9. Helmet use- Always wear a helmet when riding a bicycle, motorcycle, rollerblading or skateboarding. 5. Safe sex- If you may be exposed to sexually transmitted infections, use a condom. 11. Seat belts- Seat belts can save your live; always wear one. 12. Smoke/Carbon Monoxide detectors- These detectors need to be installed on the appropriate level of your home. Replace batteries at least once a year. 13. Skin cancer- When out in the sun please cover up and use sunscreen 15 SPF or higher. 14. Violence- If anyone is threatening or hurting you, please tell your healthcare provider.    Thoracic Strain Thoracic strain is an injury to the muscles of the upper back. A mild strain may take only 1 week to heal. Torn muscles or tendons may take 6 weeks to 2 months to heal.  HOME CARE  Put ice on the injured area.  Put ice in a plastic bag.  Place a towel between your skin and the bag.  Leave the ice on for 15-20 minutes, 03-04 times a day, for the first 2 days.  Only take medicine as told by your doctor.  Go to physical therapy and perform exercises as told by your doctor.  Use wraps and back braces as told by your  doctor.  Warm up before being active. GET HELP RIGHT AWAY IF:   There is more bruising, puffiness (swelling), or pain.  Medicine does not help the pain.  You have trouble breathing, chest pain, or a fever.  Your problems seem to be getting worse, not better. MAKE SURE YOU:   Understand these instructions.  Will watch your condition.  Will get help right away if you are not doing well or get worse. Document Released: 10/08/2007 Document Revised: 07/14/2011 Document Reviewed: 06/10/2010 [facility] Patient Information 2015 [place], [place]. This information is not intended to replace advice given to you by your health care provider. Make sure you discuss any questions you have with your health care provider.        Mediterranean Diet  Why follow it? Research shows.   Those who follow the Mediterranean diet have a reduced risk of heart disease    The diet is associated with a reduced incidence of Parkinson's and Alzheimer's diseases   People following the diet may have longer life expectancies and lower rates of chronic diseases    The Dietary Guidelines for Americans recommends the Mediterranean diet as an eating plan to promote health and prevent disease  What Is the Mediterranean Diet?    Healthy eating plan based on typical foods and recipes of Mediterranean-style cooking   The diet is primarily a plant based diet; these foods should make up a majority of meals   Starches - Plant based foods should make up a majority of meals - They are an important sources of vitamins, minerals, energy, antioxidants, and fiber - Choose whole grains, foods high in fiber and minimally processed items  - Typical grain sources include wheat, oats, barley, corn, brown rice, bulgar, farro, millet, polenta, couscous  - Various types of beans include chickpeas, lentils, fava beans, black beans, white beans   Fruits  Veggies - Large quantities of antioxidant rich fruits & veggies; 6 or more servings  -  Vegetables can be eaten raw or lightly drizzled with oil and cooked  - Vegetables common to the traditional Mediterranean Diet include: artichokes, arugula, beets, broccoli, brussel sprouts, cabbage, carrots, celery, collard greens, cucumbers, eggplant, kale, leeks, lemons, lettuce, mushrooms, okra, onions, peas, peppers, potatoes, pumpkin, radishes, rutabaga, shallots, spinach, sweet potatoes, turnips, zucchini - Fruits common to the Mediterranean Diet include: apples, apricots, avocados, cherries, clementines, dates, figs, grapefruits, grapes, melons, nectarines, oranges, peaches, pears, pomegranates, strawberries, tangerines  Fats - Replace butter and margarine with healthy oils, such as olive oil, canola oil, and tahini  - Limit nuts to no more than a handful a day  - Nuts include walnuts, almonds, pecans, pistachios, pine nuts  - Limit or avoid candied, honey roasted or heavily salted nuts - Olives are central to the [employer] - can be eaten whole or used in a variety of dishes   Meats Protein - Limiting red meat: no more than a few times a month - When eating red meat: choose lean cuts and keep the portion to the size of deck of cards -  Eggs: approx. 0 to 4 times a week  - Fish and lean poultry: at least 2 a week  - Healthy protein sources include, chicken, [place], lean beef, lamb - Increase intake of seafood such as tuna, salmon, trout, mackerel, shrimp, scallops - Avoid or limit high fat processed meats such as sausage and bacon  Dairy - Include moderate amounts of low fat dairy products  - Focus on healthy dairy such as fat free yogurt, skim milk, low or reduced fat cheese - Limit dairy products higher in fat such as whole or 2% milk, cheese, ice cream  Alcohol - Moderate amounts of red wine is ok  - No more than 5 oz daily for women (all ages) and men older than age 24  - No more than 10 oz of wine daily for men younger than 37  Other - Limit sweets and other desserts  - Use  herbs and spices instead of salt to flavor foods  - Herbs and spices common to the traditional Mediterranean Diet include: basil, bay leaves, chives, cloves, cumin, fennel, garlic, lavender, marjoram, mint, oregano, parsley, pepper, rosemary, sage, savory, sumac, tarragon, thyme   It's not just a diet, it's a lifestyle:    The Mediterranean diet includes lifestyle factors typical of those in the region    Foods, drinks and meals are best eaten with others and savored   Daily physical activity is important for overall good health   This could be strenuous exercise like running and aerobics   This could also be more leisurely activities such as walking, housework, yard-work, or taking the stairs   Moderation is the key; a balanced and healthy diet accommodates most foods and drinks   Consider portion sizes and frequency of consumption of certain foods   Meal Ideas & Options:    Breakfast:  o Whole wheat toast or whole wheat English muffins with peanut butter & hard boiled egg o Steel cut oats topped with apples & cinnamon and skim milk  o Fresh fruit: banana, strawberries, melon, berries, peaches  o Smoothies: strawberries, bananas, greek yogurt, peanut butter o Low fat greek yogurt with blueberries and granola  o Egg white omelet with spinach and mushrooms o Breakfast couscous: whole wheat couscous, apricots, skim milk, cranberries    Sandwiches:  o Hummus and grilled vegetables (peppers, zucchini, squash) on whole wheat bread   o Grilled chicken on whole wheat pita with lettuce, tomatoes, cucumbers or tzatziki  o Tuna salad on whole wheat bread: tuna salad made with greek yogurt, olives, red peppers, capers, green onions o Garlic rosemary lamb pita: lamb sauted with garlic, rosemary, salt & pepper; add lettuce, cucumber, greek yogurt to pita - flavor with lemon juice and black pepper    Seafood:  o Mediterranean grilled salmon, seasoned with garlic, basil, parsley, lemon juice and black  pepper o Shrimp, lemon, and spinach whole-grain pasta salad made with low fat greek yogurt  o Seared scallops with lemon orzo  o Seared tuna steaks seasoned salt, pepper, coriander topped with tomato mixture of olives, tomatoes, olive oil, minced garlic, parsley, green onions and cappers    Meats:  o Herbed greek chicken salad with kalamata olives, cucumber, feta  o Red bell peppers stuffed with spinach, bulgur, lean ground beef (or lentils) & topped with feta   o Kebabs: skewers of chicken, tomatoes, onions, zucchini, squash  o [place] burgers: made with red onions, mint, dill, lemon juice, feta cheese topped with roasted red peppers  Vegetarian o Cucumber salad: cucumbers, artichoke hearts, celery, red onion, feta cheese, tossed in olive oil & lemon juice  o Hummus and whole grain pita points with a greek salad (lettuce, tomato, feta, olives, cucumbers, red onion) o Lentil soup with celery, carrots made with vegetable broth, garlic, salt and pepper  o Tabouli salad: parsley, bulgur, mint, scallions, cucumbers, tomato, radishes, lemon juice, olive oil, salt and pepper.    Pelvic pain- Try to keep a food diary to see if you are having these symptoms related some food sensitivity.  Stay physically active because this helps normalize bowel habits.  If these symptoms continue, then schedule another visit. We may have to refer you to GI specialist.

## 2014-02-11 LAB — VITAMIN D 25 HYDROXY (VIT D DEFICIENCY, FRACTURES): Vit D, 25-Hydroxy: 25 ng/mL — ABNORMAL LOW (ref 30–89)

## 2014-02-20 LAB — HEMOCCULT GUIAC POC 1CARD (OFFICE)
Card #2 Fecal Occult Blod, POC: POSITIVE
Card #3 Fecal Occult Blood, POC: POSITIVE
FECAL OCCULT BLD: POSITIVE
FECAL OCCULT BLD: POSITIVE
FECAL OCCULT BLD: POSITIVE
FECAL OCCULT BLD: POSITIVE
Fecal Occult Blood, POC: POSITIVE
Fecal Occult Blood, POC: POSITIVE
Fecal Occult Blood, POC: POSITIVE

## 2014-02-21 ENCOUNTER — Other Ambulatory Visit: Payer: Self-pay | Admitting: Family Medicine

## 2014-02-21 DIAGNOSIS — R102 Pelvic and perineal pain: Secondary | ICD-10-CM

## 2014-02-21 DIAGNOSIS — K921 Melena: Secondary | ICD-10-CM

## 2014-02-24 ENCOUNTER — Encounter: Payer: Self-pay | Admitting: *Deleted

## 2014-02-24 ENCOUNTER — Encounter: Payer: Self-pay | Admitting: Physician Assistant

## 2014-03-02 ENCOUNTER — Ambulatory Visit (INDEPENDENT_AMBULATORY_CARE_PROVIDER_SITE_OTHER): Payer: 59 | Admitting: Physician Assistant

## 2014-03-02 ENCOUNTER — Encounter: Payer: Self-pay | Admitting: Physician Assistant

## 2014-03-02 ENCOUNTER — Other Ambulatory Visit (INDEPENDENT_AMBULATORY_CARE_PROVIDER_SITE_OTHER): Payer: 59

## 2014-03-02 VITALS — BP 102/74 | HR 72 | Ht 65.0 in | Wt 178.0 lb

## 2014-03-02 DIAGNOSIS — R195 Other fecal abnormalities: Secondary | ICD-10-CM

## 2014-03-02 DIAGNOSIS — D509 Iron deficiency anemia, unspecified: Secondary | ICD-10-CM

## 2014-03-02 LAB — IRON AND TIBC
%SAT: 10 % — AB (ref 20–55)
Iron: 39 ug/dL — ABNORMAL LOW (ref 42–145)
TIBC: 373 ug/dL (ref 250–470)
UIBC: 334 ug/dL (ref 125–400)

## 2014-03-02 LAB — IRON: IRON: 40 ug/dL — AB (ref 42–145)

## 2014-03-02 MED ORDER — BENEFIBER PO POWD: ORAL | Status: DC

## 2014-03-02 MED ORDER — MOVIPREP 100 G PO SOLR: 1.0000 | Freq: Once | ORAL | Status: DC

## 2014-03-02 NOTE — Progress Notes (Addendum)
Patient ID: Martha [name], female   DOB: 1965/12/04, 48 y.o.   MRN: [id number]    HPI: Martha [name] is a 48 year old female referred for evaluation by Dr. Ellsworth Lennox due to a recent change in bowel habits, lower abdominal pain, and heme-positive stools.  She states that for the past month or so her stools have been hard. She has occasional lower abdominal pain that is not exacerbated by ingestion of food and not relieved with defecation. She describes the pain as an ache that is not crampy and does not radiate.. She has no heartburn, no nausea or vomiting, her appetite has been good and her weight has  been stable. She has not had any bright red blood per rectum nor has she had any melena. She was recently seen by her primary care provider for a routine physical and was found to have 3 out of 3 fecal occult blood test cards positive for blood. She had seen Dr. Penelope [name] in the past for an upper endoscopy in 2011 or 2012, at which time she says she was found to have an ulcer. She was on Nexium for several months and then discontinued it and has had no epigastric pain since. Recent blood work found her to be anemic as well. Her menses have been normal and not heavy. Her last menstrual period was last week. She denies excessive NSAID use.    Past Medical History  Diagnosis Date  . Ulcer 05/05/2010    Peptic Ulcer/ H. Pylori +  . Arthritis   . Vitamin D deficiency     Past Surgical History  Procedure Laterality Date  . Esophagogastroduodenoscopy  05/05/2010    +ulcer; +h. Pylori  . Tonsillectomy    . Deep neck lymph node biopsy / excision    . Foot surgery     Family History  Problem Relation Age of Onset  . Anemia Mother   . Hypertension Father   . Hyperlipidemia Father   . Asthma Sister   . Hypertension Sister   . Hyperlipidemia Sister   . Multiple sclerosis Maternal Grandfather   . Gout Paternal Grandfather   . Liver disease Paternal Grandfather    History  Substance Use Topics  .  Smoking status: Former Smoker    Quit date: 12/07/1989  . Smokeless tobacco: Never Used  . Alcohol Use: Yes   Current Outpatient Prescriptions  Medication Sig Dispense Refill  . MOVIPREP 100 G SOLR Take 1 kit (200 g total) by mouth once.  1 kit  0  . Wheat Dextrin (BENEFIBER) POWD Take 1 tablespoon by mouth daily in water or juice    0   No current facility-administered medications for this visit.   No Known Allergies   Review of Systems: Gen: Denies any fever, chills, sweats, anorexia, fatigue, weakness, malaise, weight loss, and sleep disorder CV: Denies chest pain, angina, palpitations, syncope, orthopnea, PND, peripheral edema, and claudication. Resp: Denies dyspnea at rest, dyspnea with exercise, cough, sputum, wheezing, coughing up blood, and pleurisy. GI: Denies vomiting blood, jaundice, and fecal incontinence.   Denies dysphagia or odynophagia. GU : Denies urinary burning, blood in urine, urinary frequency, urinary hesitancy, nocturnal urination, and urinary incontinence. MS: Denies joint pain, limitation of movement, and swelling, stiffness, low back pain, extremity pain. Denies muscle weakness, cramps, atrophy.  Derm: Denies rash, itching, dry skin, hives, moles, warts, or unhealing ulcers.  Psych: Denies depression, anxiety, memory loss, suicidal ideation, hallucinations, paranoia, and confusion. Heme: Denies bruising, bleeding, and enlarged lymph nodes. Neuro:  Denies any headaches, dizziness, paresthesias. Endo:  Denies any problems with DM, thyroid, adrenal function   LABS:  CBC on 02/10/2014 had a white blood cell count 5.9, hemoglobin 11.9, hematocrit 36.5, platelets 336,000, MCV 77.8. Chem profile on 02/10/2014 had an alkaline phosphatase of 74, AST 18, ALT 9, total bili 0.7.  Physical Exam: BP 102/74  Pulse 72  Ht 5' 5"  (1.651 m)  Wt 178 lb (80.74 kg)  BMI 29.62 kg/m2  LMP 02/23/2014 Constitutional: Pleasant,well-developed, well nourished female in no acute  distress. HEENT: Normocephalic and atraumatic. Conjunctivae are normal. No scleral icterus. Neck supple.no thyromegaly Cardiovascular: Normal rate, regular rhythm.  Pulmonary/chest: Effort normal and breath sounds normal. No wheezing, rales or rhonchi. Abdominal: Soft, nondistended, nontender. Bowel sounds active throughout. There are no masses palpable. No hepatomegaly. Extremities: no edema Lymphadenopathy: No cervical adenopathy noted. Neurological: Alert and oriented to person place and time. Skin: Skin is warm and dry. No rashes noted. Psychiatric: Normal mood and affect. Behavior is normal.  ASSESSMENT AND PLAN: 48 year old female found to have heme positive stools and anemia referred for evaluation. She has also had a recent change in bowel habits. She will be scheduled for colonoscopy to evaluate for polyps, neoplasia, or inflammatory process.The risks, benefits, and alternatives to colonoscopy with possible biopsy and possible polypectomy were discussed with the patient and they consent to proceed.  Iron, ferritin, and TIBC will be obtained. In the meantime she's been advised to use Benefiber heaping tablespoon in juice daily and increase fruit, fiber, and water in her diet. Further recommendations will be made pending the findings of her colonoscopy.    Ricky Doan, Vita Barley PA-C 03/02/2014, 11:07 AM  Addendum: Reviewed and agree with initial management. Jerene Bears, MD

## 2014-03-02 NOTE — Patient Instructions (Signed)
You have been scheduled for a colonoscopy. Please follow written instructions given to you at your visit today.  Please pick up your prep kit at the pharmacy within the next 1-3 days. If you use inhalers (even only as needed), please bring them with you on the day of your procedure. Your physician has requested that you go to www.startemmi.com and enter the access code given to you at your visit today. This web site gives a general overview about your procedure. However, you should still follow specific instructions given to you by our office regarding your preparation for the procedure.  Your physician has requested that you go to the basement for the following lab work before leaving today: Iron, Ferritin, TIBC  Please purchase Benefiber over-the-counter and take 1 tablespoon in water or juice once daily.  Please increase the fluid in your diet.

## 2014-03-03 LAB — FERRITIN: Ferritin: 14 ng/mL (ref 10.0–291.0)

## 2014-03-06 ENCOUNTER — Other Ambulatory Visit: Payer: Self-pay | Admitting: *Deleted

## 2014-03-06 MED ORDER — FERROUS SULFATE 325 (65 FE) MG PO TABS: ORAL_TABLET | ORAL | Status: DC

## 2014-03-07 ENCOUNTER — Telehealth: Payer: Self-pay | Admitting: Internal Medicine

## 2014-03-07 NOTE — Telephone Encounter (Signed)
Received 4 pages from [facility], sent to Dr. Hilarie Fredrickson. 03/07/14/ss

## 2014-03-08 ENCOUNTER — Telehealth: Payer: Self-pay | Admitting: Internal Medicine

## 2014-03-08 NOTE — Telephone Encounter (Signed)
Received records, 4 pages, from [facility], sent to Dr. Hilarie Fredrickson. 03/08/14/ss.

## 2014-03-14 ENCOUNTER — Encounter: Payer: Self-pay | Admitting: Internal Medicine

## 2014-04-26 ENCOUNTER — Other Ambulatory Visit: Payer: Self-pay

## 2014-04-26 ENCOUNTER — Ambulatory Visit (AMBULATORY_SURGERY_CENTER): Payer: 59 | Admitting: Internal Medicine

## 2014-04-26 ENCOUNTER — Encounter: Payer: Self-pay | Admitting: Internal Medicine

## 2014-04-26 VITALS — BP 134/69 | HR 59 | Temp 96.9°F | Resp 28 | Ht 65.0 in | Wt 178.0 lb

## 2014-04-26 DIAGNOSIS — D509 Iron deficiency anemia, unspecified: Secondary | ICD-10-CM

## 2014-04-26 DIAGNOSIS — D125 Benign neoplasm of sigmoid colon: Secondary | ICD-10-CM

## 2014-04-26 DIAGNOSIS — K635 Polyp of colon: Secondary | ICD-10-CM

## 2014-04-26 DIAGNOSIS — R195 Other fecal abnormalities: Secondary | ICD-10-CM

## 2014-04-26 MED ORDER — SODIUM CHLORIDE 0.9 % IV SOLN: 500.0000 mL | INTRAVENOUS | Status: DC

## 2014-04-26 NOTE — Progress Notes (Signed)
A/ox3 pleased with MAC, report to Annette RN 

## 2014-04-26 NOTE — Op Note (Signed)
[facility]  [address] [place], [address]   COLONOSCOPY PROCEDURE REPORT  PATIENT: Martha, [name]  MR#: [id number] BIRTHDATE: [birth year] , 56  yrs. old GENDER: female ENDOSCOPIST: Jerene Bears, MD REFERRED RD:EYCXKGY McPherson, M.D. PROCEDURE DATE:  04/26/2014 PROCEDURE:   Colonoscopy with snare polypectomy First Screening Colonoscopy - Avg.  risk and is 50 yrs.  old or older - No.  Prior Negative Screening - Now for repeat screening. N/A  History of Adenoma - Now for follow-up colonoscopy & has been > or = to 3 yrs.  N/A  Polyps Removed Today? No.  Polyps Removed Today? Yes. ASA CLASS:   Class II INDICATIONS:iron deficiency anemia and heme-positive stool. MEDICATIONS: Monitored anesthesia care and Propofol 200 mg IV  DESCRIPTION OF PROCEDURE:   After the risks benefits and alternatives of the procedure were thoroughly explained, informed consent was obtained.  The digital rectal exam revealed no rectal mass.   The LB [id number]  endoscope was introduced through the anus and advanced to the terminal ileum which was intubated for a short distance. No adverse events experienced.   The quality of the prep was good, using MoviPrep  The instrument was then slowly withdrawn as the colon was fully examined.   COLON FINDINGS: The examined terminal ileum appeared to be normal. Two sessile polyps measuring 5 mm in size were found in the sigmoid colon.  Polypectomies were performed with a cold snare.  The resection was complete, the polyp tissue was completely retrieved and sent to histology.   There was mild diverticulosis noted in the ascending colon and sigmoid colon.  Retroflexed views revealed no abnormalities. The time to cecum=4 minutes 31 seconds.  Withdrawal time=11 minutes 51 seconds.  The scope was withdrawn and the procedure completed. COMPLICATIONS: There were no immediate complications.  ENDOSCOPIC IMPRESSION: 1.   The examined terminal ileum  appeared to be normal 2.   Two sessile polyps were found in the sigmoid colon; polypectomies were performed with a cold snare 3.   Mild diverticulosis was noted in the ascending colon and sigmoid colon  RECOMMENDATIONS: 1.  Await pathology results 2.  High fiber diet 3.  Given heme-positive stool and anemia would pursue upper endoscopy  eSigned:  Jerene Bears, MD 04/26/2014 11:28 AM   cc: Ellsworth Lennox, MD and The Patient

## 2014-04-26 NOTE — Patient Instructions (Addendum)
YOU HAD AN ENDOSCOPIC PROCEDURE TODAY AT THE [facility]: Refer to the procedure report that was given to you for any specific questions about what was found during the examination.  If the procedure report does not answer your questions, please call your gastroenterologist to clarify.  If you requested that your care partner not be given the details of your procedure findings, then the procedure report has been included in a sealed envelope for you to review at your convenience later.  YOU SHOULD EXPECT: Some feelings of bloating in the abdomen. Passage of more gas than usual.  Walking can help get rid of the air that was put into your GI tract during the procedure and reduce the bloating. If you had a lower endoscopy (such as a colonoscopy or flexible sigmoidoscopy) you may notice spotting of blood in your stool or on the toilet paper. If you underwent a bowel prep for your procedure, then you may not have a normal bowel movement for a few days.  DIET: Your first meal following the procedure should be a light meal and then it is ok to progress to your normal diet.  A half-sandwich or bowl of soup is an example of a good first meal.  Heavy or fried foods are harder to digest and may make you feel nauseous or bloated.  Likewise meals heavy in dairy and vegetables can cause extra gas to form and this can also increase the bloating.  Drink plenty of fluids but you should avoid alcoholic beverages for 24 hours.  ACTIVITY: Your care partner should take you home directly after the procedure.  You should plan to take it easy, moving slowly for the rest of the day.  You can resume normal activity the day after the procedure however you should NOT DRIVE or use heavy machinery for 24 hours (because of the sedation medicines used during the test).    SYMPTOMS TO REPORT IMMEDIATELY: A gastroenterologist can be reached at any hour.  During normal business hours, 8:30 AM to 5:00 PM Monday through Friday,  call [phone].  After hours and on weekends, please call the GI answering service at [phone] who will take a message and have the physician on call contact you.   Following lower endoscopy (colonoscopy or flexible sigmoidoscopy):  Excessive amounts of blood in the stool  Significant tenderness or worsening of abdominal pains  Swelling of the abdomen that is new, acute  Fever of 100F or higher  FOLLOW UP: If any biopsies were taken you will be contacted by phone or by letter within the next 1-3 weeks.  Call your gastroenterologist if you have not heard about the biopsies in 3 weeks.  Our staff will call the home number listed on your records the next business day following your procedure to check on you and address any questions or concerns that you may have at that time regarding the information given to you following your procedure. This is a courtesy call and so if there is no answer at the home number and we have not heard from you through the emergency physician on call, we will assume that you have returned to your regular daily activities without incident.  SIGNATURES/CONFIDENTIALITY: You and/or your care partner have signed paperwork which will be entered into your electronic medical record.  These signatures attest to the fact that that the information above on your After Visit Summary has been reviewed and is understood.  Full responsibility of the confidentiality of this   discharge information lies with you and/or your care-partner.     Handouts were given to your care partner on polyps, diverticulosis,and a high fiber diet with liberal fluid intake. You may resume your current medications today. Await biopsy results. Please call if any questions or concerns. Upper Endoscopy scheduled.

## 2014-04-26 NOTE — Progress Notes (Signed)
Called to room to assist during endoscopic procedure.  Patient ID and intended procedure confirmed with present staff. Received instructions for my participation in the procedure from the performing physician.  

## 2014-04-26 NOTE — Progress Notes (Signed)
No problems noted in the recovery room. Maw  

## 2014-05-01 ENCOUNTER — Telehealth: Payer: Self-pay | Admitting: *Deleted

## 2014-05-01 NOTE — Telephone Encounter (Signed)
Left message that we called for f/u 

## 2014-05-08 ENCOUNTER — Encounter: Payer: Self-pay | Admitting: Internal Medicine

## 2014-05-15 ENCOUNTER — Ambulatory Visit (AMBULATORY_SURGERY_CENTER): Payer: Self-pay

## 2014-05-15 VITALS — Ht 65.0 in | Wt 180.0 lb

## 2014-05-15 DIAGNOSIS — D509 Iron deficiency anemia, unspecified: Secondary | ICD-10-CM

## 2014-05-15 NOTE — Progress Notes (Signed)
No allergies to eggs or soy No home oxygen No diet/weight loss meds No past problems with anesthesia  [email or web address]

## 2014-05-19 NOTE — Addendum Note (Signed)
Addended by: Steva Ready on: 05/19/2014 07:08 AM   Modules accepted: Level of Service

## 2014-05-30 ENCOUNTER — Encounter: Payer: Self-pay | Admitting: Internal Medicine

## 2014-05-30 ENCOUNTER — Ambulatory Visit (AMBULATORY_SURGERY_CENTER): Payer: 59 | Admitting: Internal Medicine

## 2014-05-30 VITALS — BP 118/82 | HR 58 | Temp 98.0°F | Resp 12 | Ht 65.0 in | Wt 180.0 lb

## 2014-05-30 DIAGNOSIS — K2950 Unspecified chronic gastritis without bleeding: Secondary | ICD-10-CM

## 2014-05-30 DIAGNOSIS — R195 Other fecal abnormalities: Secondary | ICD-10-CM

## 2014-05-30 DIAGNOSIS — D509 Iron deficiency anemia, unspecified: Secondary | ICD-10-CM

## 2014-05-30 MED ORDER — SODIUM CHLORIDE 0.9 % IV SOLN: 500.0000 mL | INTRAVENOUS | Status: DC

## 2014-05-30 NOTE — Patient Instructions (Signed)
YOU HAD AN ENDOSCOPIC PROCEDURE TODAY AT THE [facility]: Refer to the procedure report that was given to you for any specific questions about what was found during the examination.  If the procedure report does not answer your questions, please call your gastroenterologist to clarify.  If you requested that your care partner not be given the details of your procedure findings, then the procedure report has been included in a sealed envelope for you to review at your convenience later.  YOU SHOULD EXPECT: Some feelings of bloating in the abdomen. Passage of more gas than usual.  Walking can help get rid of the air that was put into your GI tract during the procedure and reduce the bloating. If you had a lower endoscopy (such as a colonoscopy or flexible sigmoidoscopy) you may notice spotting of blood in your stool or on the toilet paper. If you underwent a bowel prep for your procedure, then you may not have a normal bowel movement for a few days.  DIET: Your first meal following the procedure should be a light meal and then it is ok to progress to your normal diet.  A half-sandwich or bowl of soup is an example of a good first meal.  Heavy or fried foods are harder to digest and may make you feel nauseous or bloated.  Likewise meals heavy in dairy and vegetables can cause extra gas to form and this can also increase the bloating.  Drink plenty of fluids but you should avoid alcoholic beverages for 24 hours.  ACTIVITY: Your care partner should take you home directly after the procedure.  You should plan to take it easy, moving slowly for the rest of the day.  You can resume normal activity the day after the procedure however you should NOT DRIVE or use heavy machinery for 24 hours (because of the sedation medicines used during the test).    SYMPTOMS TO REPORT IMMEDIATELY: A gastroenterologist can be reached at any hour.  During normal business hours, 8:30 AM to 5:00 PM Monday through Friday,  call [phone].  After hours and on weekends, please call the GI answering service at [phone] who will take a message and have the physician on call contact you.   Following upper endoscopy (EGD)  Vomiting of blood or coffee ground material  New chest pain or pain under the shoulder blades  Painful or persistently difficult swallowing  New shortness of breath  Fever of 100F or higher  Black, tarry-looking stools  FOLLOW UP: If any biopsies were taken you will be contacted by phone or by letter within the next 1-3 weeks.  Call your gastroenterologist if you have not heard about the biopsies in 3 weeks.  Our staff will call the home number listed on your records the next business day following your procedure to check on you and address any questions or concerns that you may have at that time regarding the information given to you following your procedure. This is a courtesy call and so if there is no answer at the home number and we have not heard from you through the emergency physician on call, we will assume that you have returned to your regular daily activities without incident.  SIGNATURES/CONFIDENTIALITY: You and/or your care partner have signed paperwork which will be entered into your electronic medical record.  These signatures attest to the fact that that the information above on your After Visit Summary has been reviewed and is understood.  Full responsibility   of the confidentiality of this discharge information lies with you and/or your care-partner.  Resume medications. 

## 2014-05-30 NOTE — Progress Notes (Signed)
Called to room to assist during endoscopic procedure.  Patient ID and intended procedure confirmed with present staff. Received instructions for my participation in the procedure from the performing physician.  

## 2014-05-30 NOTE — Progress Notes (Signed)
Report to PACU, RN, vss, BBS= Clear.  

## 2014-05-30 NOTE — Op Note (Signed)
[facility]  [address] [place], [address]   ENDOSCOPY PROCEDURE REPORT  PATIENT: Martha [name], Martha [name]  MR#: [id number] BIRTHDATE: Mar 03, 1966 , 7  yrs. old GENDER: female ENDOSCOPIST: Jerene Bears, MD REFERRED BY:  Ellsworth Lennox, M.D. PROCEDURE DATE:  05/30/2014 PROCEDURE:  EGD w/ biopsy ASA CLASS:     Class II INDICATIONS:  iron deficiency anemia. MEDICATIONS: Monitored anesthesia care and Propofol 200 mg IV TOPICAL ANESTHETIC: none  DESCRIPTION OF PROCEDURE: After the risks benefits and alternatives of the procedure were thoroughly explained, informed consent was obtained.  The LB [id number] endoscope was introduced through the mouth and advanced to the second portion of the duodenum , Without limitations.  The instrument was slowly withdrawn as the mucosa was fully examined.  ESOPHAGUS: The mucosa of the esophagus appeared normal.  Z-line regular at 36 cm.  STOMACH: The mucosa of the stomach appeared normal.  Cold forcep biopsies were taken at the gastric body, antrum and angularis to evaluate for h.  pylori.  DUODENUM: The duodenal mucosa showed no abnormalities in the bulb and 2nd part of the duodenum.  Cold forceps biopsies were taken in the bulb and second portion. Retroflexed views revealed a small hiatal hernia.     The scope was then withdrawn from the patient and the procedure completed.  COMPLICATIONS: There were no immediate complications.  ENDOSCOPIC IMPRESSION: 1.   The mucosa of the esophagus appeared normal 2.   The mucosa of the stomach appeared normal; multiple biopsies 3.   The duodenal mucosa showed no abnormalities in the bulb and 2nd part of the duodenum cold forceps biopsies were taken in the bulb and second portion  RECOMMENDATIONS: 1.  Await biopsy results 2.  If pathology results unrevealing would recommend video capsule endoscopy to complete the GI work-up of iron deficiency and heme positive stool  eSigned:  Jerene Bears, MD 2014-05-30 10:42:33.995   CC:The Patient and Ellsworth Lennox, MD

## 2014-05-31 ENCOUNTER — Other Ambulatory Visit: Payer: Self-pay

## 2014-05-31 ENCOUNTER — Telehealth: Payer: Self-pay

## 2014-05-31 ENCOUNTER — Telehealth: Payer: Self-pay | Admitting: *Deleted

## 2014-05-31 DIAGNOSIS — D508 Other iron deficiency anemias: Secondary | ICD-10-CM

## 2014-05-31 NOTE — Telephone Encounter (Signed)
  Follow up Call-  Call back number 05/30/2014 04/26/2014  Post procedure Call Back phone  # [phone]  Permission to leave phone message Yes Yes     Patient questions:  Do you have a fever, pain , or abdominal swelling? No. Pain Score  0 *  Have you tolerated food without any problems? Yes.    Have you been able to return to your normal activities? Yes.    Do you have any questions about your discharge instructions: Diet   No. Medications  No. Follow up visit  No.  Do you have questions or concerns about your Care? No.  Actions: * If pain score is 4 or above: No action needed, pain <4.

## 2014-05-31 NOTE — Telephone Encounter (Signed)
Pt scheduled and appt letter mailed

## 2014-05-31 NOTE — Telephone Encounter (Signed)
-----   Message from Jerene Bears, MD sent at 05/30/2014 11:51 AM EST ----- Needs OV in 3 months Oral iron until then CBC and iron studies (ferritin and IBC) 1 week before OV Thanks JMP

## 2014-06-05 ENCOUNTER — Encounter: Payer: Self-pay | Admitting: Internal Medicine

## 2014-08-02 ENCOUNTER — Encounter: Payer: Self-pay | Admitting: *Deleted

## 2014-08-15 IMAGING — CR DG LUMBAR SPINE COMPLETE 4+V
5 series · 5 of 5 positions shown · non-contrast
Comparison: None.

CLINICAL DATA: Back pain

LUMBAR SPINE - COMPLETE 4+ VIEW

[AP]
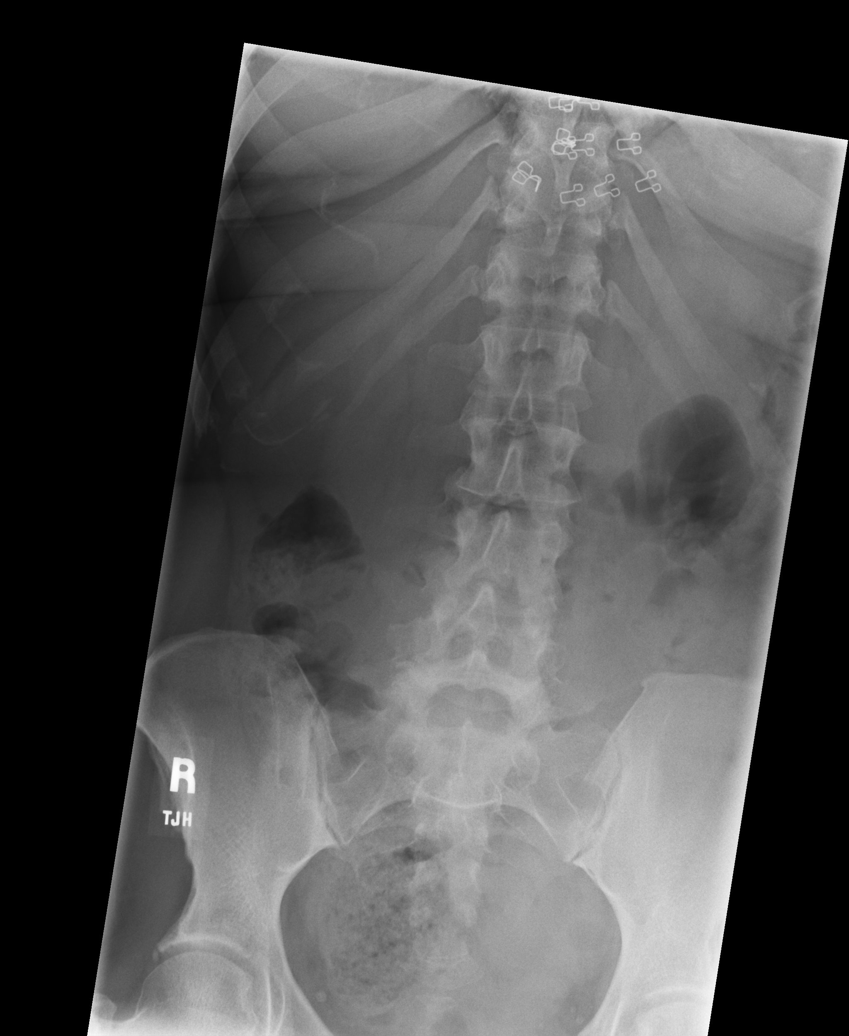

[rpo]
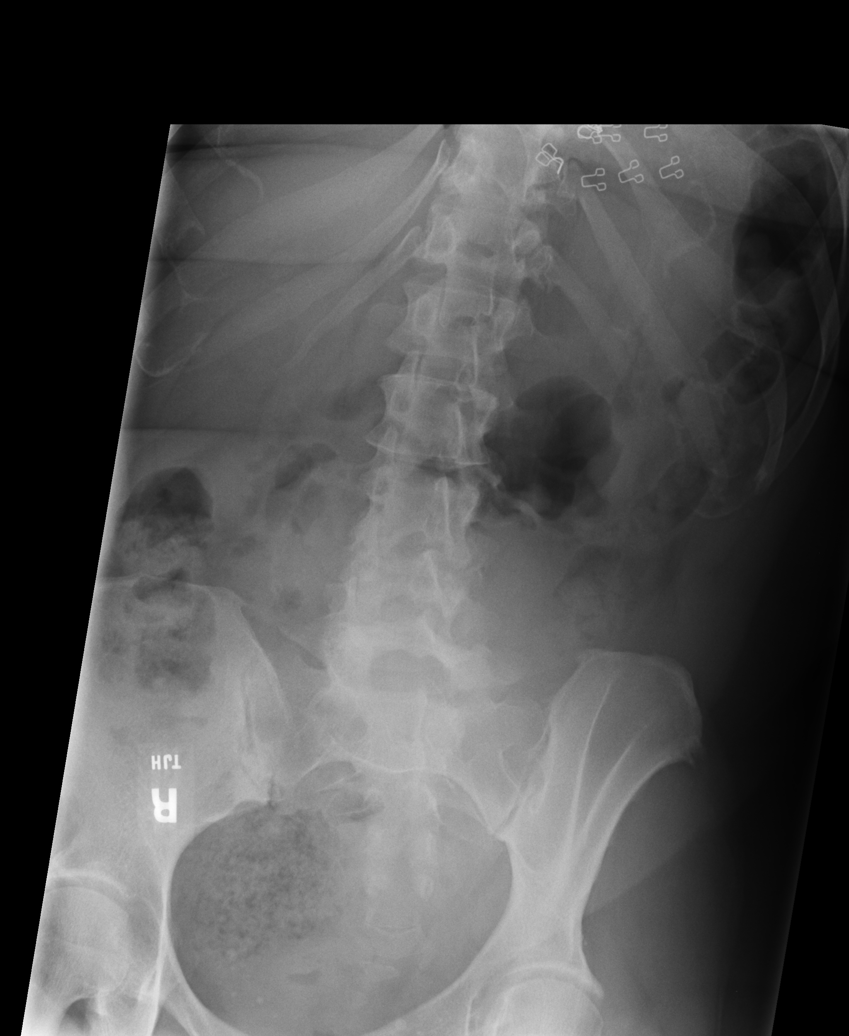

[lpo]
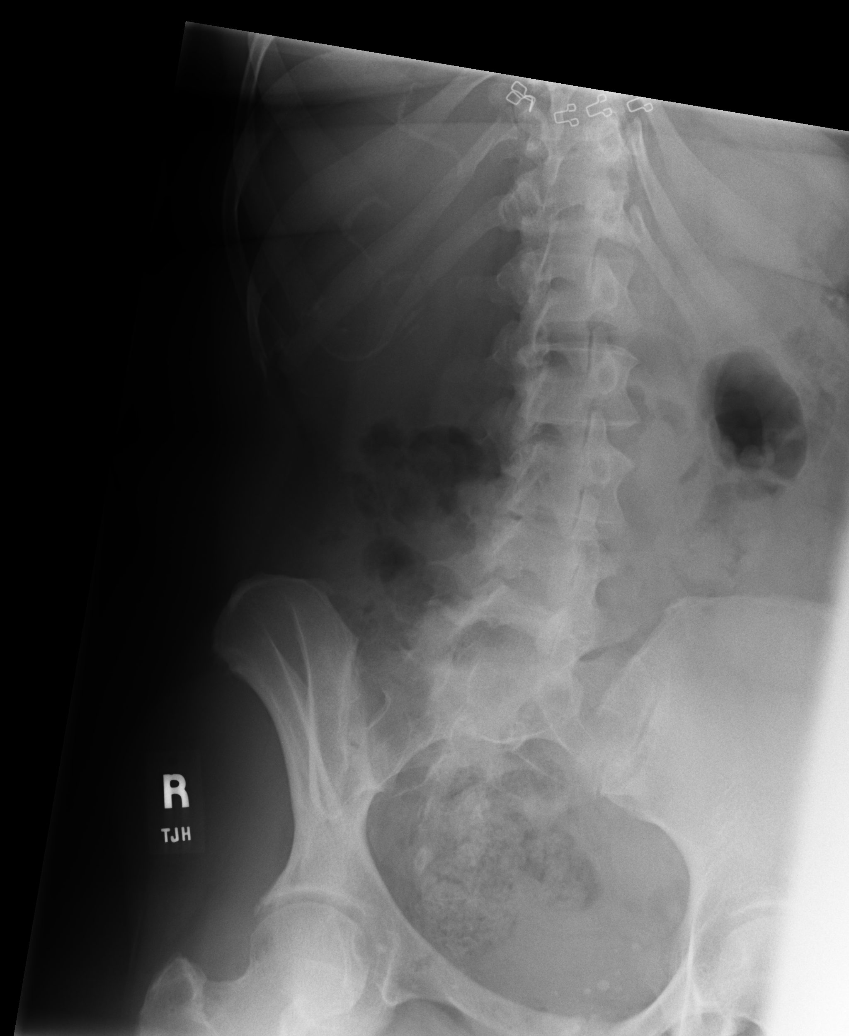

[lateral]
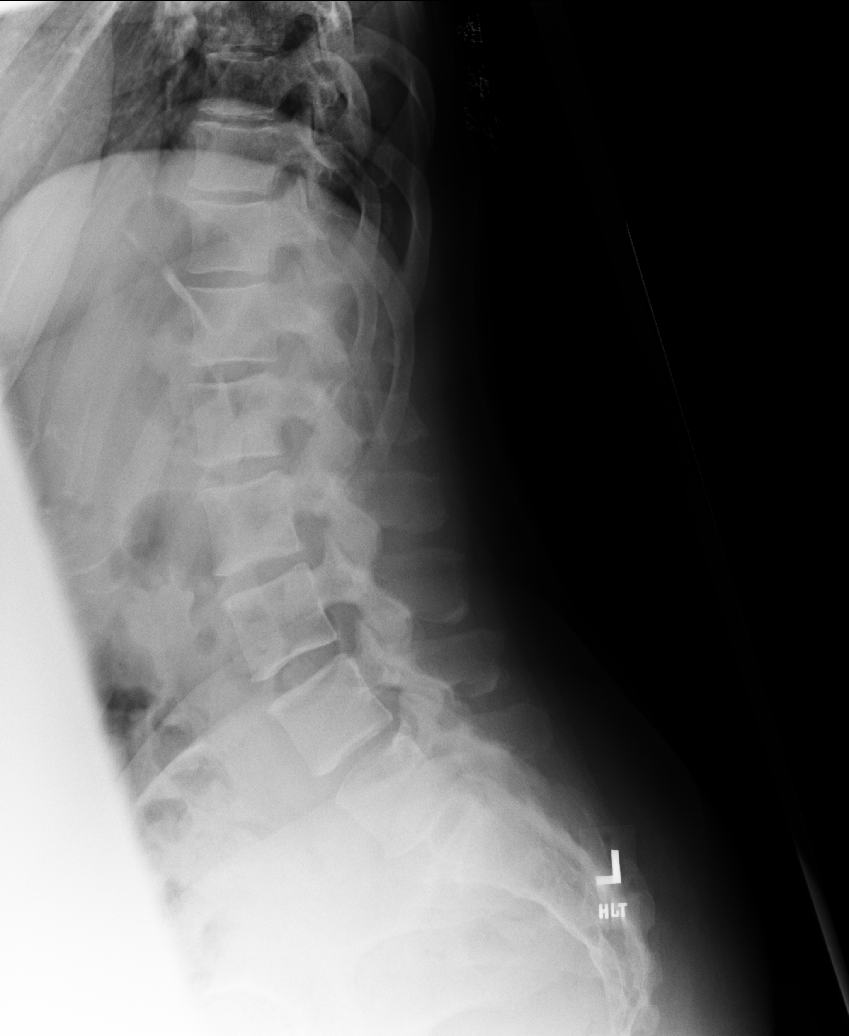

[l5 s1]
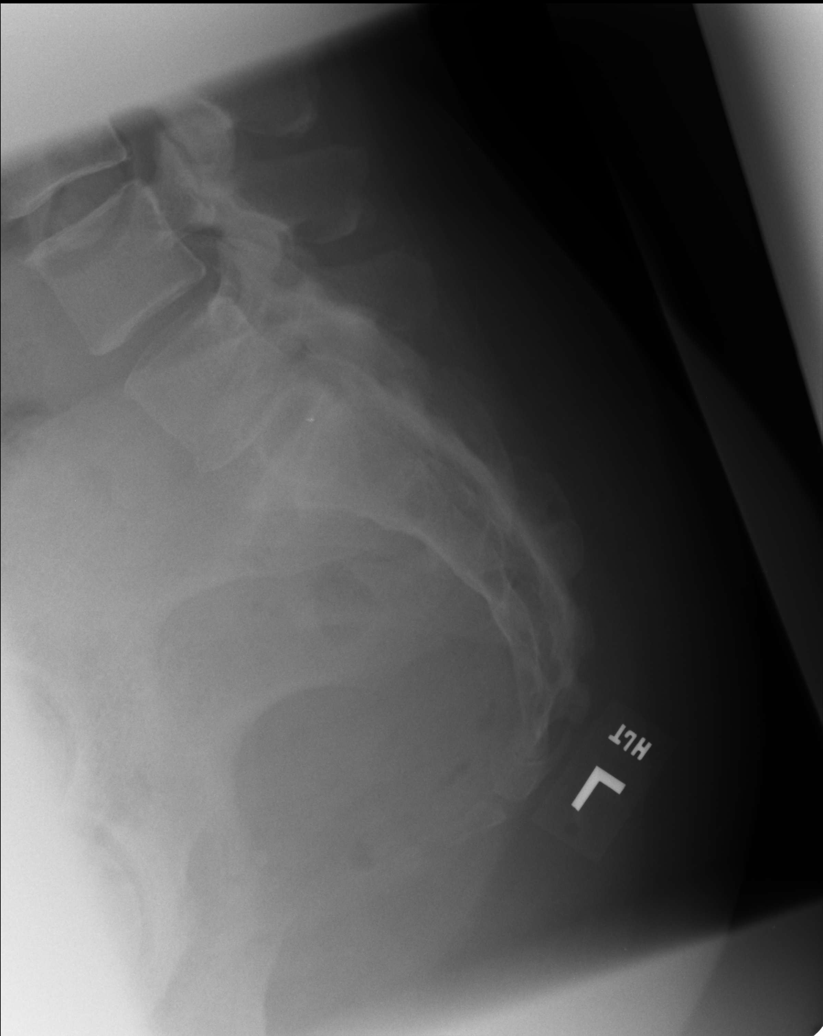

[5 of 5 positions shown; findings below may reference images not displayed]

FINDINGS: No evidence of acute fracture, or malalignment.
Vertebral body heights and intervertebral disc spaces are
maintained.  There is mild sclerosis of the apophyseal joints at L4-
L5 and L5-S1 consistent with facet arthropathy.  No evidence of
spondylolysis, or spondylolisthesis.  No significant foraminal
stenosis on the oblique views.  Visualized bowel gas pattern is
unremarkable.
IMPRESSION: 1.  No acute fracture, malalignment, spondylolysis, or
spondylolisthesis.
2.  Lower lumbar facet arthropathy

Clinically significant discrepancy from primary report, if
provided: None

## 2014-08-28 ENCOUNTER — Ambulatory Visit: Payer: 59 | Admitting: Internal Medicine

## 2014-10-16 ENCOUNTER — Ambulatory Visit (INDEPENDENT_AMBULATORY_CARE_PROVIDER_SITE_OTHER): Payer: 59 | Admitting: Family Medicine

## 2014-10-16 VITALS — BP 122/70 | HR 70 | Temp 98.8°F | Resp 17 | Ht 65.0 in | Wt 183.4 lb

## 2014-10-16 DIAGNOSIS — J029 Acute pharyngitis, unspecified: Secondary | ICD-10-CM | POA: Diagnosis not present

## 2014-10-16 LAB — POCT RAPID STREP A (OFFICE): Rapid Strep A Screen: NEGATIVE

## 2014-10-16 MED ORDER — AMOXICILLIN 500 MG PO TABS: 1000.0000 mg | ORAL_TABLET | Freq: Two times a day (BID) | ORAL | Status: DC

## 2014-10-16 NOTE — Progress Notes (Signed)
Subjective:    Patient ID: Martha [name], female    DOB: 1965/05/19, 49 y.o.   MRN: [id number]  10/16/2014  Sinusitis   HPI This 49 y.o. female presents for evaluation of sinus congestion.  Onset of headache for a few weeks; daily headaches for ten days.  Mild sore throat started two days ago.  No fever/chills; +worsening night sweats.  No ear pain.  L sided sore throat.  No pain with swallowing.  +rhinorrhea today; green congestion.  No nasal congestion.  Scant cough.  No sneezing.  No SOB.  No n/v/d.  No tick bites; rare outside activities; cats that go out rarely.  Reader evaluator; grades student responses.  No tobacco.  No medications for illness.     Review of Systems  Constitutional: Positive for diaphoresis. Negative for fever, chills and fatigue.  HENT: Positive for rhinorrhea and sore throat. Negative for ear pain, postnasal drip, sinus pressure, trouble swallowing and voice change.   Eyes: Negative for photophobia and visual disturbance.  Respiratory: Positive for cough. Negative for shortness of breath.   Cardiovascular: Negative for chest pain, palpitations and leg swelling.  Gastrointestinal: Negative for nausea, vomiting, abdominal pain, diarrhea and constipation.  Skin: Negative for rash.  Neurological: Positive for headaches. Negative for dizziness and light-headedness.    Past Medical History  Diagnosis Date  . Ulcer 05/05/2010    Peptic Ulcer/ H. Pylori +  . Arthritis   . Vitamin D deficiency   . GERD (gastroesophageal reflux disease)   . Tubular adenoma of colon    Past Surgical History  Procedure Laterality Date  . Esophagogastroduodenoscopy  05/05/2010    +ulcer; +h. Pylori  . Tonsillectomy    . Deep neck lymph node biopsy / excision    . Foot surgery    . Colonoscopy     No Known Allergies Current Outpatient Prescriptions  Medication Sig Dispense Refill  . ferrous sulfate 325 (65 FE) MG tablet Take one po daily (Patient not taking: Reported on 10/16/2014)  30 tablet 3  . ibuprofen (ADVIL,MOTRIN) 200 MG tablet Take 200 mg by mouth every 6 (six) hours as needed.    . Multiple Vitamin (MULTIVITAMIN WITH MINERALS) TABS tablet Take 1 tablet by mouth daily.    . Wheat Dextrin (BENEFIBER) POWD Take 1 tablespoon by mouth daily in water or juice (Patient not taking: Reported on 05/30/2014)  0   No current facility-administered medications for this visit.   History   Social History  . Marital Status: Married    Spouse Name: N/A  . Number of Children: 2  . Years of Education: N/A   Occupational History  . Unemployed    Social History Main Topics  . Smoking status: Former Smoker    Quit date: 12/07/1989  . Smokeless tobacco: Never Used  . Alcohol Use: Yes     Comment: 1-2 / week  . Drug Use: No  . Sexual Activity: Yes    Birth Control/ Protection: Surgical   Other Topics Concern  . Not on file   Social History Narrative   Marital status: married.   Children: 2 children (12, 8)   Employment: Publishing rights manager.             Objective:    BP 122/70 mmHg  Pulse 70  Temp(Src) 98.8 F (37.1 C) (Oral)  Resp 17  Ht 5\' 5"  (1.651 m)  Wt 183 lb 6.4 oz (83.19 kg)  BMI 30.52 kg/m2  SpO2 98%  LMP 09/21/2014 Physical  Exam  Constitutional: She is oriented to person, place, and time. She appears well-developed and well-nourished. No distress.  HENT:  Head: Normocephalic and atraumatic.  Right Ear: Tympanic membrane, external ear and ear canal normal.  Left Ear: Tympanic membrane, external ear and ear canal normal.  Nose: Rhinorrhea present. No mucosal edema. Right sinus exhibits no maxillary sinus tenderness and no frontal sinus tenderness. Left sinus exhibits no maxillary sinus tenderness and no frontal sinus tenderness.  Mouth/Throat: Oropharynx is clear and moist. No oropharyngeal exudate.  Eyes: Conjunctivae and EOM are normal. Pupils are equal, round, and reactive to light.  Neck: Normal range of motion. Neck supple.    Cardiovascular: Normal rate, regular rhythm and normal heart sounds.  Exam reveals no gallop and no friction rub.   No murmur heard. Pulmonary/Chest: Effort normal and breath sounds normal. She has no wheezes. She has no rales.  Lymphadenopathy:    She has no cervical adenopathy.  Neurological: She is alert and oriented to person, place, and time.  Skin: She is not diaphoretic.  Psychiatric: She has a normal mood and affect. Her behavior is normal.  Nursing note and vitals reviewed.  Results for orders placed or performed in visit on 10/16/14  POCT rapid strep A  Result Value Ref Range   Rapid Strep A Screen Negative Negative       Assessment & Plan:   1. Sore throat    -New. -Send throat culture; treat empirically per patient request with Amoxicillin while awaiting throat culture results. -Recommend supportive care with Tylenol or Ibuprofen: RTC for inability to swallow. -Recommend Mucinex for cough and congestion.   No orders of the defined types were placed in this encounter.    No Follow-up on file.   Kristi Elayne Guerin, M.D. [facility] [address] [place], [place]  [address] [phone] phone [phone] fax

## 2014-10-16 NOTE — Patient Instructions (Signed)

## 2014-10-19 LAB — CULTURE, GROUP A STREP: Organism ID, Bacteria: NORMAL

## 2015-02-27 ENCOUNTER — Other Ambulatory Visit: Payer: Self-pay

## 2015-02-27 DIAGNOSIS — Z1231 Encounter for screening mammogram for malignant neoplasm of breast: Secondary | ICD-10-CM

## 2015-03-14 ENCOUNTER — Ambulatory Visit
Admission: RE | Admit: 2015-03-14 | Discharge: 2015-03-14 | Disposition: A | Discharge status: Home / Self Care | Payer: 59 | Source: Ambulatory Visit

## 2015-03-14 DIAGNOSIS — Z1231 Encounter for screening mammogram for malignant neoplasm of breast: Secondary | ICD-10-CM

## 2015-03-16 ENCOUNTER — Other Ambulatory Visit: Payer: Self-pay | Admitting: Obstetrics and Gynecology

## 2015-03-16 DIAGNOSIS — R928 Other abnormal and inconclusive findings on diagnostic imaging of breast: Secondary | ICD-10-CM

## 2015-03-26 ENCOUNTER — Ambulatory Visit
Admission: RE | Admit: 2015-03-26 | Discharge: 2015-03-26 | Disposition: A | Discharge status: Home / Self Care | Payer: 59 | Source: Ambulatory Visit | Attending: Obstetrics and Gynecology | Admitting: Obstetrics and Gynecology

## 2015-03-26 ENCOUNTER — Other Ambulatory Visit: Payer: 59

## 2015-03-26 DIAGNOSIS — R928 Other abnormal and inconclusive findings on diagnostic imaging of breast: Secondary | ICD-10-CM

## 2015-04-30 ENCOUNTER — Encounter: Payer: 59 | Admitting: Physician Assistant

## 2015-05-07 ENCOUNTER — Ambulatory Visit (INDEPENDENT_AMBULATORY_CARE_PROVIDER_SITE_OTHER): Payer: 59 | Admitting: Family Medicine

## 2015-05-07 ENCOUNTER — Encounter: Payer: Self-pay | Admitting: Physician Assistant

## 2015-05-07 VITALS — BP 105/66 | HR 91 | Temp 97.5°F | Resp 17 | Ht 65.0 in | Wt 180.0 lb

## 2015-05-07 DIAGNOSIS — Z130 Encounter for screening for diseases of the blood and blood-forming organs and certain disorders involving the immune mechanism: Secondary | ICD-10-CM

## 2015-05-07 DIAGNOSIS — R002 Palpitations: Secondary | ICD-10-CM | POA: Diagnosis not present

## 2015-05-07 DIAGNOSIS — Z13220 Encounter for screening for lipoid disorders: Secondary | ICD-10-CM | POA: Diagnosis not present

## 2015-05-07 DIAGNOSIS — Z1329 Encounter for screening for other suspected endocrine disorder: Secondary | ICD-10-CM

## 2015-05-07 DIAGNOSIS — Z0000 Encounter for general adult medical examination without abnormal findings: Secondary | ICD-10-CM | POA: Diagnosis not present

## 2015-05-07 LAB — CBC
HCT: 34.6 % — ABNORMAL LOW (ref 36.0–46.0)
HEMOGLOBIN: 10.9 g/dL — AB (ref 12.0–15.0)
MCH: 24.4 pg — AB (ref 26.0–34.0)
MCHC: 31.5 g/dL (ref 30.0–36.0)
MCV: 77.4 fL — AB (ref 78.0–100.0)
MPV: 9.3 fL (ref 8.6–12.4)
Platelets: 349 10*3/uL (ref 150–400)
RBC: 4.47 MIL/uL (ref 3.87–5.11)
RDW: 13.2 % (ref 11.5–15.5)
WBC: 6.5 10*3/uL (ref 4.0–10.5)

## 2015-05-07 LAB — LIPID PANEL
Cholesterol: 133 mg/dL (ref 125–200)
HDL: 42 mg/dL — AB (ref >=46)
LDL Cholesterol: 51 mg/dL (ref <130)
TRIGLYCERIDES: 198 mg/dL — AB (ref <150)
Total CHOL/HDL Ratio: 3.2 Ratio (ref <=5.0)
VLDL: 40 mg/dL — ABNORMAL HIGH (ref <30)

## 2015-05-07 LAB — COMPLETE METABOLIC PANEL WITH GFR
ALBUMIN: 3.8 g/dL (ref 3.6–5.1)
ALK PHOS: 78 U/L (ref 33–115)
ALT: 7 U/L (ref 6–29)
AST: 12 U/L (ref 10–35)
BILIRUBIN TOTAL: 0.4 mg/dL (ref 0.2–1.2)
BUN: 12 mg/dL (ref 7–25)
CO2: 27 mmol/L (ref 20–31)
Calcium: 9.2 mg/dL (ref 8.6–10.2)
Chloride: 102 mmol/L (ref 98–110)
Creat: 0.7 mg/dL (ref 0.50–1.10)
Glucose, Bld: 73 mg/dL (ref 65–99)
Potassium: 4.4 mmol/L (ref 3.5–5.3)
Sodium: 138 mmol/L (ref 135–146)
TOTAL PROTEIN: 7 g/dL (ref 6.1–8.1)

## 2015-05-07 LAB — TSH: TSH: 0.57 u[IU]/mL (ref 0.350–4.500)

## 2015-05-07 NOTE — Progress Notes (Signed)
[facility] [address] [place] [address] [phone]  Date:  05/07/2015   Name:  Martha [name]   DOB:  Jun 22, 1965   MRN:  [id number]  PCP:  Ellsworth Lennox, MD    History of Present Illness:  Martha [name] is a 50 y.o. female patient who presents to [facility] for an annual physical exam.  --palpitations: once , in a blue moon, more present when she was on diet pills.  She may have this with some stressors.  No lightheaded, sob, tremulousness, or chest pains associated.  Not associated with exertion.  No blood in the stool or darkened stools.  No hx of thyroid disease  Diet: Avoiding carbonated beverages, rare sweets.  She eats very little fried foods.  Eats all meats.  Hydration: water little.  Drinks simply lemonade, juice.    BM: qd, no constipation or diarrhea.  Urination is normal, no frequency, dysuria, hematuria.  She has some leaking for one year.  If she jumps.  2 children 16 and 12.  Vaginal births.  This will be followed by   Sleep: 6 hours, difficulty staying asleep.  Vivid dreams.  3am--going on for years.    Social Activity Homemaker Hobbies: going out with friends, read, playing keyboards etOH use: 1 glass per week  Tobacco use: over 30 years, quit 93 Illicit drug use: None.  Patient's last menstrual period was 05/25/2014.   Patient Active Problem List   Diagnosis Date Noted  . Anemia, iron deficiency 03/02/2014  . Heme positive stool 03/02/2014  . CTS (carpal tunnel syndrome) 06/27/2012  . Unspecified vitamin D deficiency 03/19/2012  . Multiple joint pain 03/19/2012    Past Medical History  Diagnosis Date  . Ulcer 05/05/2010    Peptic Ulcer/ H. Pylori +  . Arthritis   . Vitamin D deficiency   . GERD (gastroesophageal reflux disease)   . Tubular adenoma of colon     Past Surgical History  Procedure Laterality Date  . Esophagogastroduodenoscopy  05/05/2010    +ulcer; +h. Pylori  . Tonsillectomy    . Deep neck lymph node biopsy /  excision    . Foot surgery    . Colonoscopy      Social History  Substance Use Topics  . Smoking status: Former Smoker    Quit date: 12/07/1989  . Smokeless tobacco: Never Used  . Alcohol Use: 0.6 oz/week    1 Standard drinks or equivalent per week     Comment: 1-2 / week    Family History  Problem Relation Age of Onset  . Anemia Mother   . Hyperlipidemia Mother   . Hypertension Father   . Hyperlipidemia Father   . Asthma Sister   . Hypertension Sister   . Hyperlipidemia Sister   . Multiple sclerosis Maternal Grandfather   . Gout Paternal Grandfather   . Liver disease Paternal Grandfather   . Colon cancer Neg Hx     No Known Allergies  Medication list has been reviewed and updated.  Current Outpatient Prescriptions on File Prior to Visit  Medication Sig Dispense Refill  . ibuprofen (ADVIL,MOTRIN) 200 MG tablet Take 200 mg by mouth every 6 (six) hours as needed. Reported on 05/07/2015    . Multiple Vitamin (MULTIVITAMIN WITH MINERALS) TABS tablet Take 1 tablet by mouth daily. Reported on 05/07/2015     No current facility-administered medications on file prior to visit.    Review of Systems  Constitutional: Negative for fever and chills.  HENT: Negative for ear discharge, ear pain and sore throat.   Eyes: Negative for blurred vision and double vision.  Respiratory: Negative for cough, shortness of breath and wheezing.   Cardiovascular: Positive for palpitations. Negative for chest pain and leg swelling.  Gastrointestinal: Negative for nausea, vomiting and diarrhea.  Genitourinary: Negative for dysuria, frequency and hematuria.  Skin: Negative for itching and rash.  Neurological: Negative for dizziness and headaches.     Physical Examination: BP 105/66 mmHg  Pulse 91  Temp(Src) 97.5 F (36.4 C) (Oral)  Resp 17  Ht 5\' 5"  (1.651 m)  Wt 180 lb (81.647 kg)  BMI 29.95 kg/m2  SpO2 99%  LMP 05/25/2014 Ideal Body Weight: Weight in (lb) to have BMI = 25:  149.9  Physical Exam  Constitutional: She is oriented to person, place, and time. She appears well-developed and well-nourished. No distress.  HENT:  Head: Normocephalic and atraumatic.  Right Ear: Tympanic membrane, external ear and ear canal normal.  Left Ear: Tympanic membrane, external ear and ear canal normal.  Nose: Right sinus exhibits no maxillary sinus tenderness and no frontal sinus tenderness. Left sinus exhibits no maxillary sinus tenderness and no frontal sinus tenderness.  Mouth/Throat: Oropharynx is clear and moist. No uvula swelling. No oropharyngeal exudate, posterior oropharyngeal edema or posterior oropharyngeal erythema.  Eyes: Conjunctivae and EOM are normal. Pupils are equal, round, and reactive to light.  Neck: Normal range of motion. Neck supple. No thyromegaly present.  Cardiovascular: Normal rate, regular rhythm, normal heart sounds and intact distal pulses.  Exam reveals no gallop, no distant heart sounds and no friction rub.   No murmur heard. Pulmonary/Chest: Effort normal and breath sounds normal. No respiratory distress. She has no decreased breath sounds. She has no wheezes. She has no rhonchi.  Abdominal: Soft. Bowel sounds are normal. She exhibits no distension and no mass. There is no tenderness.  Musculoskeletal: Normal range of motion. She exhibits no edema or tenderness.  Lymphadenopathy:       Head (right side): No submandibular, no tonsillar, no preauricular and no posterior auricular adenopathy present.       Head (left side): No submandibular, no tonsillar, no preauricular and no posterior auricular adenopathy present.    She has no cervical adenopathy.  Neurological: She is alert and oriented to person, place, and time. No cranial nerve deficit. She exhibits normal muscle tone. Coordination normal.  Skin: Skin is warm and dry. She is not diaphoretic.  Psychiatric: She has a normal mood and affect. Her behavior is normal.     Assessment and  Plan: [name] is a 50 y.o. female who is here today for annual physical exam, and discussion of palpitations -ekg appears normal. -i advised of alarming signs that would warrant further weekend, and if this occurs again more often. -general labs performed. -obgyn followed paps -declines flu at this time.  Annual physical exam - Plan: EKG 12-Lead, CBC, COMPLETE METABOLIC PANEL WITH GFR, Lipid panel, TSH  Screening for deficiency anemia - Plan: CBC  Screening for thyroid disorder - Plan: TSH  Screening for lipid disorders - Plan: Lipid panel  Palpitations - Plan: EKG 12-Lead  Ivar Drape, PA-C [facility] 1/5/20173:07 PM    Ivar Drape, PA-C [facility] 05/07/2015 2:29 PM

## 2015-05-07 NOTE — Patient Instructions (Signed)
Below is information on the black cohosh as we had discussed.   And below further is more information on kegel exercises. The gynecologist will discuss this with you, as you had discussed--this is just more information.    Black Cohosh, Cimicifuga racemosa oral dosage forms What is this medicine? BLACK COHOSH (blak KOH hosh) or Cimicifuga racemosa is a dietary supplement. It is being promoted to help support female health problems, like the symptoms of menopause (hot flashes). Black cohosh is also promoted to ease menstrual pain or pre-menstrual syndrome (PMS). The FDA does not recognize black cohosh as being safe or effective for any use at this time, and warns against its use in pregnancy. This supplement may be used for other purposes; ask your health care provider or pharmacist if you have questions. This medicine may be used for other purposes; ask your health care provider or pharmacist if you have questions. What should I tell my health care provider before I take this medicine? They need to know if you have any of these conditions: -cancer -endometriosis or uterine fibroids -high blood pressure -infertility -kidney disease -liver disease -menstrual changes or irregular periods -unusual vaginal or uterine bleeding -an unusual or allergic reaction to black cohosh, soybeans, tartrazine dye (yellow dye number 5), other medicines, foods, dyes, or preservatives -pregnant or trying to get pregnant -breast-feeding How should I use this medicine? Take this herb by mouth with a glass of water. Follow the directions on the package labeling, or talk to your health care professional. Do not use for longer than 6 months without the advice of a health care professional. Do not use if you are pregnant or breast-feeding. Talk to your obstetrician-gynecologist or certified nurse-midwife. This herb is not for use in children under the age of 8 years. Overdosage: If you think you have taken too much of  this medicine contact a poison control center or emergency room at once. NOTE: This medicine is only for you. Do not share this medicine with others. What if I miss a dose? If you miss a dose, take it as soon as you can. If it is almost time for your next dose, take only that dose. Do not take double or extra doses. What may interact with this medicine? -female hormones, like estrogens or progestins and birth control pills -fertility treatments -medicines for blood pressure -medicines for diabetes This list may not describe all possible interactions. Give your health care provider a list of all the medicines, herbs, non-prescription drugs, or dietary supplements you use. Also tell them if you smoke, drink alcohol, or use illegal drugs. Some items may interact with your medicine. What should I watch for while using this medicine? Since this herb is derived from a plant, allergic reactions are possible. Stop using this herb if you develop a rash. You may need to see your health care professional, or inform them that this occurred. Report any unusual side effects promptly. If you are taking this herb for menstrual or menopausal symptoms, visit your doctor or health care professional for regular checks on your progress. You should have a complete check-up every 6 months. You will need a regular breast and pelvic exam while on this therapy. Follow the advice of your doctor or health care professional. If you have any reason to think you are pregnant, stop taking this herb at once and contact your doctor or health care professional. Herbal or dietary supplements are not regulated like medicines. Rigid quality control standards are not required  for dietary supplements. The purity and strength of these products can vary. The safety and effect of this dietary supplement for a certain disease or illness is not well known. This product is not intended to diagnose, treat, cure or prevent any disease. The Food and  Drug Administration suggests the following to help consumers protect themselves: -Always read product labels and follow directions. -Natural does not mean a product is safe for humans to take. -Look for products that include USP after the ingredient name. This means that the manufacturer followed the standards of the [place] Pharmacopoeia. -Supplements made or sold by a nationally known food or drug company are more likely to be made under tight controls. You can write to the company for more information about how the product was made. What side effects may I notice from receiving this medicine? Side effects that you should report to your doctor or health care professional as soon as possible: -allergic reactions like skin rash, itching or hives, swelling of the face, lips, or tongue -difficulty breathing, shortness of breath, or wheezing -easy bruising -fast heartbeat, slow heartbeat, or palpitations -headache -high blood pressure -severe nausea or vomiting -unusually weak or tired Side effects that usually do not require medical attention (report to your doctor or health care professional if they continue or are bothersome): -heartburn -mild upset stomach This list may not describe all possible side effects. Call your doctor for medical advice about side effects. You may report side effects to FDA at 1-800-FDA-1088. Where should I keep my medicine? Keep out of the reach of children. Store at room temperature between 15 and 30 degrees C (59 and 86 degrees C). Throw away any unused herb after the expiration date. NOTE: This sheet is a summary. It may not cover all possible information. If you have questions about this medicine, talk to your doctor, pharmacist, or health care provider.    2016, Elsevier/Gold Standard. (2007-07-22 13:29:09)  Keeping You Healthy  Get These Tests  Blood Pressure- Have your blood pressure checked once a year by your health care provider.  Normal blood pressure  is 120/80.  Weight- Have your body mass index (BMI) calculated to screen for obesity.  BMI is measure of body fat based on height and weight.  You can also calculate your own BMI at GravelBags.it.  Cholesterol- Have your cholesterol checked every 5 years starting at age 42 then yearly starting at age 6.  Chlamydia, HIV, and other sexually transmitted diseases- Get screened every year until age 17, then within three months of each new sexual provider.  Pap Test - Every 1-5 years; discuss with your health care provider.  Mammogram- Every 1-2 years starting at age 75--50  Take these medicines  Calcium with Vitamin D-Your body needs 1200 mg of Calcium each day and [phone] IU of Vitamin D daily.  Your body can only absorb 500 mg of Calcium at a time so Calcium must be taken in 2 or 3 divided doses throughout the day.  Multivitamin with folic acid- Once daily if it is possible for you to become pregnant.  Get these Immunizations  Gardasil-Series of three doses; prevents HPV related illness such as genital warts and cervical cancer.  Menactra-Single dose; prevents meningitis.  Tetanus shot- Every 10 years.  Flu shot-Every year.  Take these steps 1. Do not smoke-Your healthcare provider can help you quit.  For tips on how to quit go to www.smokefree.gov or call 1-800 QUITNOW. 2. Be physically active- Exercise 5 days a  week for at least 30 minutes.  If you are not already physically active, start slow and gradually work up to 30 minutes of moderate physical activity.  Examples of moderate activity include walking briskly, dancing, swimming, bicycling, etc. 3. Breast Cancer- A self breast exam every month is important for early detection of breast cancer.  For more information and instruction on self breast exams, ask your healthcare provider or https://www.patel.info/. 4. Eat a healthy diet- Eat a variety of healthy foods such as fruits, vegetables, whole  grains, low fat milk, low fat cheeses, yogurt, lean meats, poultry and fish, beans, nuts, tofu, etc.  For more information go to www. Thenutritionsource.org 5. Drink alcohol in moderation- Limit alcohol intake to one drink or less per day. Never drink and drive. 6. Depression- Your emotional health is as important as your physical health.  If you're feeling down or losing interest in things you normally enjoy please talk to your healthcare provider about being screened for depression. 7. Dental visit- Brush and floss your teeth twice daily; visit your dentist twice a year. 8. Eye doctor- Get an eye exam at least every 2 years. 9. Helmet use- Always wear a helmet when riding a bicycle, motorcycle, rollerblading or skateboarding. 24. Safe sex- If you may be exposed to sexually transmitted infections, use a condom. 11. Seat belts- Seat belts can save your live; always wear one. 12. Smoke/Carbon Monoxide detectors- These detectors need to be installed on the appropriate level of your home. Replace batteries at least once a year. 13. Skin cancer- When out in the sun please cover up and use sunscreen 15 SPF or higher. 14. Violence- If anyone is threatening or hurting you, please tell your healthcare provider. Kegel Exercises The goal of Kegel exercises is to isolate and exercise your pelvic floor muscles. These muscles act as a hammock that supports the rectum, vagina, small intestine, and uterus. As the muscles weaken, the hammock sags and these organs are displaced from their normal positions. Kegel exercises can strengthen your pelvic floor muscles and help you to improve bladder and bowel control, improve sexual response, and help reduce many problems and some discomfort during pregnancy. Kegel exercises can be done anywhere and at any time. HOW TO PERFORM KEGEL EXERCISES 1. Locate your pelvic floor muscles. To do this, squeeze (contract) the muscles that you use when you try to stop the flow of urine.  You will feel a tightness in the vaginal area (women) and a tight lift in the rectal area (men and women). 2. When you begin, contract your pelvic muscles tight for 2-5 seconds, then relax them for 2-5 seconds. This is one set. Do 4-5 sets with a short pause in between. 3. Contract your pelvic muscles for 8-10 seconds, then relax them for 8-10 seconds. Do 4-5 sets. If you cannot contract your pelvic muscles for 8-10 seconds, try 5-7 seconds and work your way up to 8-10 seconds. Your goal is 4-5 sets of 10 contractions each day. Keep your stomach, buttocks, and legs relaxed during the exercises. Perform sets of both short and long contractions. Vary your positions. Perform these contractions 3-4 times per day. Perform sets while you are:   Lying in bed in the morning.  Standing at lunch.  Sitting in the late afternoon.  Lying in bed at night. You should do 40-50 contractions per day. Do not perform more Kegel exercises per day than recommended. Overexercising can cause muscle fatigue. Continue these exercises for for at least 15-20  weeks or as directed by your caregiver.   This information is not intended to replace advice given to you by your health care provider. Make sure you discuss any questions you have with your health care provider.   Document Released: 04/07/2012 Document Revised: 05/12/2014 Document Reviewed: 04/07/2012 Elsevier Interactive Patient Education [employer].

## 2015-05-11 NOTE — Progress Notes (Signed)
EKG read and patient discussed with Ms.English. Agree with assessment and plan of care per her note. Further workup if sx's recur.

## 2015-08-07 ENCOUNTER — Telehealth: Payer: Self-pay

## 2015-08-07 NOTE — Telephone Encounter (Signed)
I will place in your box Dr. Carlota Raspberry.

## 2015-08-07 NOTE — Telephone Encounter (Signed)
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

## 2015-08-08 NOTE — Telephone Encounter (Signed)
Patient seen by Ivar Drape, PA-C. I signed off on visit. Will give form to [place] , but I can help if needed.

## 2015-08-10 ENCOUNTER — Telehealth: Payer: Self-pay

## 2015-08-10 NOTE — Telephone Encounter (Signed)
Colletta [place] English PA was to take care of filling out form - talked to pt and will call her to pick up when ready.

## 2015-08-10 NOTE — Telephone Encounter (Signed)
Pt is looking for a work form that needed to be comepleted -vitals and immunications -  Best number [phone]

## 2015-08-10 NOTE — Telephone Encounter (Signed)
Questionnaire shows low risk of tb exposure.  Forms signed and ready for pick up.  Tuberculosis Risk Questionnaire  1. no Were you born outside the [place] in one of the following parts of the world: [place], [place], [place], [place] or [place]?    2. Yes 1990[place], [employer] Have you traveled outside the [place] and lived for more than one month in one of the following parts of the world: [place], [place], [place], [place] or [place]?    3. No Do you have a compromised immune system such as from any of the following conditions:HIV/AIDS, organ or bone marrow transplantation, diabetes, immunosuppressive medicines (e.g. Prednisone, Remicaide), leukemia, lymphoma, cancer of the head or neck, gastrectomy or jejunal bypass, end-stage renal disease (on dialysis), or silicosis?     4. no Have you ever or do you plan on working in: a residential care center, a health care facility, a jail or prison or homeless shelter?    5. no Have you ever: injected illegal drugs, used crack cocaine, lived in a homeless shelter  or been in jail or prison?     6. no Have you ever been exposed to anyone with infectious tuberculosis?    Tuberculosis Symptom Questionnaire  Do you currently have any of the following symptoms?  1. No Unexplained cough lasting more than 3 weeks?   2. No  Unexplained fever lasting more than 3 weeks.   3. Yes, pre-menopausal Night Sweats (sweating that leaves the bedclothes and sheets wet)     4. No nShortness of Breath   5. No Chest Pain   6. No Unintentional weight loss    7. No Unexplained fatigue (very tired for no reason)

## 2015-08-22 ENCOUNTER — Ambulatory Visit (INDEPENDENT_AMBULATORY_CARE_PROVIDER_SITE_OTHER): Payer: 59 | Admitting: Physician Assistant

## 2015-08-22 VITALS — BP 110/72 | HR 76 | Temp 98.1°F | Resp 16 | Ht 65.0 in | Wt 181.8 lb

## 2015-08-22 DIAGNOSIS — Z23 Encounter for immunization: Secondary | ICD-10-CM

## 2015-08-22 DIAGNOSIS — Z111 Encounter for screening for respiratory tuberculosis: Secondary | ICD-10-CM | POA: Diagnosis not present

## 2015-08-22 NOTE — Patient Instructions (Signed)
     IF you received an x-ray today, you will receive an invoice from [facility]. Please contact [facility] at [phone] with questions or concerns regarding your invoice.   IF you received labwork today, you will receive an invoice from [facility]. Please contact [facility] at [phone] with questions or concerns regarding your invoice.   Our billing staff will not be able to assist you with questions regarding bills from these companies.  You will be contacted with the lab results as soon as they are available. The fastest way to get your results is to activate your My Chart account. Instructions are located on the last page of this paperwork. If you have not heard from us regarding the results in 2 weeks, please contact this office.    , 

## 2015-08-22 NOTE — Progress Notes (Signed)
CC: needs her tb test and form signed.  Subjective: Patient is here today for tb test, and form completion.  She is starting a new job and must have the tb test.  She is not able to just complete a questionnaire.  She has never had a positive TB.  She has had travel and lived in [place]?? For the [employer].  OBjective BP 110/72 mmHg  Pulse 76  Temp(Src) 98.1 F (36.7 C) (Oral)  Resp 16  Ht 5\' 5"  (1.651 m)  Wt 181 lb 12.8 oz (82.464 kg)  BMI 30.25 kg/m2  SpO2 99%  Physical Exam  Constitutional: She is oriented to person, place, and time. She appears well-developed and well-nourished. No distress.  HENT:  Head: Normocephalic and atraumatic.  Right Ear: External ear normal.  Left Ear: External ear normal.  Eyes: Conjunctivae and EOM are normal. Pupils are equal, round, and reactive to light.  Cardiovascular: Normal rate.   Pulmonary/Chest: Effort normal. No respiratory distress.  Neurological: She is alert and oriented to person, place, and time.  Skin: She is not diaphoretic.  Psychiatric: She has a normal mood and affect. Her behavior is normal.    Tuberculosis Risk Questionnaire  1. No Were you born outside the [place] in one of the following parts of the world: [place], [place], [place], [place] or [place]?    2. Yes  Have you traveled outside the [place] and lived for more than one month in one of the following parts of the world: [place], [place], [place], [place] or [place]?    3. No Do you have a compromised immune system such as from any of the following conditions:HIV/AIDS, organ or bone marrow transplantation, diabetes, immunosuppressive medicines (e.g. Prednisone, Remicaide), leukemia, lymphoma, cancer of the head or neck, gastrectomy or jejunal bypass, end-stage renal disease (on dialysis), or silicosis?     4. No Have you ever or do you plan on working in: a residential care center, a health care facility, a jail or prison or homeless  shelter?    5. No Have you ever: injected illegal drugs, used crack cocaine, lived in a homeless shelter  or been in jail or prison?     6. No Have you ever been exposed to anyone with infectious tuberculosis?    Tuberculosis Symptom Questionnaire  Do you currently have any of the following symptoms?  1. No Unexplained cough lasting more than 3 weeks?   2. No Unexplained fever lasting more than 3 weeks.   3. Yes  Night Sweats (sweating that leaves the bedclothes and sheets wet)     4. No Shortness of Breath   5. No Chest Pain   6. No Unintentional weight loss    7. No Unexplained fatigue (very tired for no reason)   Plan and Assessment:  50 year old female is here today for tb test.  This was performed today.  She will return in 48-72 hours fore reading.  We will update tetanus at this time.    Encounter for PPD test - Plan: TB Skin Test  Need for tetanus booster - Plan: Td vaccine greater than or equal to 7yo preservative free IM  Ivar Drape, PA-C [facility] 4/20/20173:24 PM

## 2015-08-24 ENCOUNTER — Encounter: Payer: 59 | Admitting: Physician Assistant

## 2015-08-24 DIAGNOSIS — Z111 Encounter for screening for respiratory tuberculosis: Secondary | ICD-10-CM

## 2015-12-25 ENCOUNTER — Other Ambulatory Visit: Payer: Self-pay | Admitting: Obstetrics and Gynecology

## 2015-12-25 DIAGNOSIS — N6310 Unspecified lump in the right breast, unspecified quadrant: Secondary | ICD-10-CM

## 2015-12-31 ENCOUNTER — Ambulatory Visit
Admission: RE | Admit: 2015-12-31 | Discharge: 2015-12-31 | Disposition: A | Discharge status: Home / Self Care | Payer: 59 | Source: Ambulatory Visit | Attending: Obstetrics and Gynecology | Admitting: Obstetrics and Gynecology

## 2015-12-31 DIAGNOSIS — N6310 Unspecified lump in the right breast, unspecified quadrant: Secondary | ICD-10-CM

## 2017-05-22 ENCOUNTER — Ambulatory Visit (INDEPENDENT_AMBULATORY_CARE_PROVIDER_SITE_OTHER): Payer: 59

## 2017-05-22 ENCOUNTER — Other Ambulatory Visit: Payer: Self-pay

## 2017-05-22 ENCOUNTER — Ambulatory Visit: Payer: 59 | Admitting: Physician Assistant

## 2017-05-22 ENCOUNTER — Encounter: Payer: Self-pay | Admitting: Physician Assistant

## 2017-05-22 VITALS — BP 129/85 | HR 70 | Temp 99.2°F | Resp 16 | Ht 65.0 in | Wt 192.4 lb

## 2017-05-22 DIAGNOSIS — M5441 Lumbago with sciatica, right side: Secondary | ICD-10-CM

## 2017-05-22 DIAGNOSIS — M546 Pain in thoracic spine: Secondary | ICD-10-CM

## 2017-05-22 DIAGNOSIS — M5442 Lumbago with sciatica, left side: Secondary | ICD-10-CM | POA: Diagnosis not present

## 2017-05-22 MED ORDER — CYCLOBENZAPRINE HCL 10 MG PO TABS: 10.0000 mg | ORAL_TABLET | Freq: Three times a day (TID) | ORAL | 0 refills | Status: DC | PRN

## 2017-05-22 MED ORDER — PREDNISONE 20 MG PO TABS: ORAL_TABLET | ORAL | 0 refills | Status: DC

## 2017-05-22 NOTE — Progress Notes (Signed)
Martha [name]  MRN: [id number] DOB: 03-May-1966  PCP: Barton Fanny, MD  Subjective:  Pt is a 52 year old female PMH CTS, anemia who presents to clinic for back pain x 4 days. Pain is of her low back and radiates down both legs.   As she was lowering to sit on the toilet "I felt something and I heard something" sounded like a "pop". Felt pop in middle of her back. She could not do anything that day.  Radiates down both legs. 4 days ago she could not walk. She has improved since four days ago, however pain is still presents. She is walking with a limp. Cannot bend forward. Pain is 8/10. Pain feels "burning and throbbing".  She takes Diclofenac for back pain regularly. Not helping current symptoms.   She denies saddle anesthesia, muscle weakness, n/t.   Review of Systems  Constitutional: Negative for diaphoresis and fatigue.  Gastrointestinal: Negative for constipation and diarrhea.  Musculoskeletal: Positive for back pain and gait problem. Negative for neck pain and neck stiffness.  Neurological: Negative for weakness.    Patient Active Problem List   Diagnosis Date Noted  . Anemia, iron deficiency 03/02/2014  . Heme positive stool 03/02/2014  . CTS (carpal tunnel syndrome) 06/27/2012  . Unspecified vitamin D deficiency 03/19/2012  . Multiple joint pain 03/19/2012    Current Outpatient Medications on File Prior to Visit  Medication Sig Dispense Refill  . diclofenac (VOLTAREN) 25 MG EC tablet Take 25 mg by mouth 2 (two) times daily.    Marland Kitchen Fe Cbn-Fe Gluc-FA-B12-C-DSS (FE 90 PLUS PO) Take by mouth.    . Ferrous Sulfate (IRON) 325 (65 Fe) MG TABS Take by mouth.    Marland Kitchen ibuprofen (ADVIL,MOTRIN) 200 MG tablet Take 200 mg by mouth every 6 (six) hours as needed. Reported on 08/22/2015    . Multiple Vitamin (MULTIVITAMIN WITH MINERALS) TABS tablet Take 1 tablet by mouth daily. Reported on 08/22/2015    . naproxen sodium (ALEVE) 220 MG tablet Take 220 mg by mouth.     No current  facility-administered medications on file prior to visit.     No Known Allergies   Objective:  BP 129/85   Pulse 70   Temp 99.2 F (37.3 C) (Oral)   Resp 16   Ht 5\' 5"  (1.651 m)   Wt 192 lb 6.4 oz (87.3 kg)   LMP 05/21/2017   SpO2 99%   BMI 32.02 kg/m   Physical Exam  Constitutional: She is oriented to person, place, and time and well-developed, well-nourished, and in no distress. No distress.  Musculoskeletal:       Lumbar back: She exhibits decreased range of motion (with flexion, extension and b/l abduction. ), tenderness and bony tenderness.  She is unable to bend forward or extend back from hips past 20 degrees.    Neurological: She is alert and oriented to person, place, and time. She has normal strength. A sensory deficit is present. She has an abnormal Straight Leg Raise Test. GCS score is 15.  Skin: Skin is warm and dry.  Psychiatric: Mood, memory, affect and judgment normal.  Vitals reviewed.   Dg Thoracic Spine 2 View  Result Date: 05/22/2017 CLINICAL DATA:  Dorsalgia EXAM: THORACIC SPINE 2 VIEWS COMPARISON:  Chest radiograph May 16, 2008 FINDINGS: Frontal and lateral views were obtained. There is no fracture or spondylolisthesis. There is slight disc space narrowing at several levels in the mid to lower thoracic region. No erosive change or  paraspinous lesion. IMPRESSION: Disc space narrowing at several levels. No fracture or spondylolisthesis. Electronically Signed   By: Lowella Grip III M.D.   On: 05/22/2017 11:38   Dg Lumbar Spine Complete  Result Date: 05/22/2017 CLINICAL DATA:  Lumbago EXAM: LUMBAR SPINE - COMPLETE 4+ VIEW COMPARISON:  January 08, 2012 FINDINGS: Frontal, lateral, spot lumbosacral lateral, and bilateral oblique views were obtained. There are 5 non-rib-bearing lumbar type vertebral bodies. There is no fracture or spondylolisthesis. Disc spaces appear unremarkable. There is facet osteoarthritic change at L4-5 and L5-S1 bilaterally.  IMPRESSION: Mild lower lumbar osteoarthritic change. No fracture or spondylolisthesis. Electronically Signed   By: Lowella Grip III M.D.   On: 05/22/2017 11:40    Assessment and Plan :  1. Acute midline thoracic back pain 2. Acute bilateral low back pain with bilateral sciatica - DG Lumbar Spine Complete; Future - DG Thoracic Spine 2 View; Future - predniSONE (DELTASONE) 20 MG tablet; Take 3 PO QAM x4days, 2 PO QAM x4days, 1 PO QAM x4days  Dispense: 24 tablet; Refill: 0 - cyclobenzaprine (FLEXERIL) 10 MG tablet; Take 1 tablet (10 mg total) by mouth 3 (three) times daily as needed for muscle spasms.  Dispense: 30 tablet; Refill: 0 - Pt c/o back pain x 4 days not relieved with diclofenac. Suspect lumbar radiculopathy 2/2 herniated disc. Plan to treat with prednisone taper and activity modification. F/u in 2-3 weeks.   Mercer Pod, PA-C  [facility] 05/22/2017 11:03 AM

## 2017-05-22 NOTE — Patient Instructions (Addendum)
Your x-rays are negative.  You may take Tylenol with your Diclofenac as needed for pain.  Flexeril is a muscle relaxer. If this makes you drowsy, take this at night. Flexeril does not interact with your Prednisone, diclofenac or Tylenol.  Start taking prednisone as directed. This will help reduce your inflammation.  Activity modification is as important as pain treatment in the management of your back pain. The goals are to lessen nerve root impingement and to avoid activities that exacerbate pain. Take it easy!  Modification or avoidance of activity does not imply prolonged complete bed rest. When the acute symptoms decrease, we recommend resuming modest physical activity as tolerated.   Come back and follow-up with me in 2-4 weeks.    Herniated Disk A herniated disk, also called a ruptured disk or slipped disk, occurs when a disk in the spine bulges out too far. Between the bones in the spine (vertebrae), there are oval disks that are made of a soft, spongy center that is surrounded by a tough outer ring. The disks connect your vertebrae, help your spine move, and absorb shocks from your movement. When you have a herniated disk, the spongy center of the disk bulges out or breaks through the outer ring. It can press on a nerve between the vertebrae and cause pain. This can occur anywhere in the back or neck area, but the lower back is most commonly affected. What are the causes? This condition may be caused by:  Age-related wear and tear. The spongy centers of spinal disks tend to shrink and dry out with age, which makes them more likely to herniate.  Sudden injury, such as a strain or sprain.  What increases the risk? Aging is the main risk factor for a herniated disk. Other risk factors include:  Being a man who is 80-69 years old.  Frequently doing activities that involve heavy lifting, bending, or twisting.  Frequently driving for long hours at a time.  Not getting enough  exercise.  Being overweight.  Smoking.  Having a family history of back problems or herniated disks.  Being pregnant or giving birth.  Having poor nutrition.  Being tall.  What are the signs or symptoms? Symptoms may vary depending on where your herniated disk is located.  A herniated disk in the lower back may cause sharp pain in: ? Part of the arm, leg, hip, or buttocks. ? The back of the lower leg (calf). ? The lower back, spreading down through the leg into the foot (sciatica).  A herniated disk in the neck may cause dizziness and vertigo. It may also cause pain or weakness in: ? The neck. ? The shoulder blades. ? Upper arm, forearm, or fingers.  You may also have muscle weakness. It may be difficult to: ? Lift your leg or arm. ? Stand on your toes. ? Squeeze tightly with one of your hands.  Other symptoms may include: ? Numbness or tingling in the affected areas of the hands, arms, feet, or legs. ? Inability to control when you urinate or when you have bowel movements. This is a rare but serious sign of a severe herniated disk in the lower back.  How is this diagnosed? This condition may be diagnosed based on:  Your symptoms.  Your medical history.  A physical exam. The exam may include: ? Straight-leg test. You will lie on your back while your health care provider lifts your leg, keeping your knee straight. If you feel pain, you likely have  a herniated disk. ? Neurological tests. This includes checking for numbness, reflexes, muscle strength, and posture.  Imaging tests, such as: ? X-rays. ? MRI. ? CT scan. ? Electromyogram (EMG) to check the nerves that control muscles. This test may be used to determine which nerves are affected by your herniated disk.  How is this treated? Treatment for this condition may include:  A short period of rest. This is usually the first treatment. ? You may be on bed rest for up to 2 days, or you may be instructed to stay  home and avoid physical activity. ? If you have a herniated disk in your lower back, avoid sitting as much as possible. Sitting increases pressure on the disk.  Medicines. These may include: ? NSAIDs to help reduce pain and swelling. ? Muscle relaxants to prevent sudden tightening of the back muscles (back spasms). ? Prescription pain medicines, if you have severe pain.  Steroid injections in the area of the herniated disk. This can help reduce pain and swelling.  Physical therapy to strengthen your back muscles.  In many cases, symptoms go away with treatment over a period of days or weeks. You will most likely be free of symptoms after 3-4 months. If other treatments do not help to relieve your symptoms, you may need surgery. Follow these instructions at home: Medicines  Take over-the-counter and prescription medicines only as told by your health care provider.  Do not drive or use heavy machinery while taking prescription pain medicine. Activity  Rest as directed.  After your rest period: ? Return to your normal activities and gradually begin exercising as told by your health care provider. Ask your health care provider what activities and exercises are safe for you. ? Use good posture. ? Avoid movements that cause pain. ? Do not lift anything that is heavier than 10 lb (4.5 kg) until your health care provider says this is safe. ? Do not sit or stand for long periods of time without changing positions. ? Do not sit for long periods of time without getting up and moving around.  If physical therapy was prescribed, do exercises as instructed.  Aim to strengthen muscles in your back and abdomen with exercises like crunches, swimming, or walking. General instructions  Do not use any products that contain nicotine or tobacco, such as cigarettes and e-cigarettes. These products can delay healing. If you need help quitting, ask your health care provider.  Do not wear high-heeled  shoes.  Do not sleep on your belly.  If you are overweight, work with your health care provider to lose weight safely.  To prevent or treat constipation while you are taking prescription pain medicine, your health care provider may recommend that you: ? Drink enough fluid to keep your urine clear or pale yellow. ? Take over-the-counter or prescription medicines. ? Eat foods that are high in fiber, such as fresh fruits and vegetables, whole grains, and beans. ? Limit foods that are high in fat and processed sugars, such as fried and sweet foods.  Keep all follow-up visits as told by your health care provider. This is important. How is this prevented?  Maintain a healthy weight.  Try to avoid stressful situations.  Maintain physical fitness. Do at least 150 minutes of moderate-intensity exercise each week, such as brisk walking or water aerobics.  When lifting objects: ? Keep your feet at least shoulder-width apart and tighten your abdominal muscles. ? Keep your spine neutral as you bend your knees  and hips. It is important to lift using the strength of your legs, not your back. Do not lock your knees straight out. ? Always ask for help to lift heavy or awkward objects. Contact a health care provider if:  You have back pain or neck pain that does not get better after 6 weeks.  You have severe pain in your back, neck, legs, or arms.  You develop numbness, tingling, or weakness in any part of your body. Get help right away if:  You cannot move your arms or legs.  You cannot control when you urinate or have bowel movements.  You feel dizzy or you faint.  You have shortness of breath. This information is not intended to replace advice given to you by your health care provider. Make sure you discuss any questions you have with your health care provider. Document Released: 04/18/2000 Document Revised: 12/17/2015 Document Reviewed: 12/17/2015 Elsevier Interactive Patient Education   2017 Between you for coming in today. I hope you feel we met your needs.  Feel free to call PCP if you have any questions or further requests.  Please consider signing up for MyChart if you do not already have it, as this is a great way to communicate with me.  Best,  [employer], PA-C

## 2017-06-04 ENCOUNTER — Ambulatory Visit: Payer: 59 | Admitting: Physician Assistant

## 2017-06-09 ENCOUNTER — Encounter: Payer: Self-pay | Admitting: Physician Assistant

## 2017-06-09 ENCOUNTER — Other Ambulatory Visit: Payer: Self-pay

## 2017-06-09 ENCOUNTER — Ambulatory Visit: Payer: 59 | Admitting: Physician Assistant

## 2017-06-09 VITALS — BP 120/74 | HR 84 | Temp 98.7°F | Resp 18 | Wt 188.0 lb

## 2017-06-09 DIAGNOSIS — M5440 Lumbago with sciatica, unspecified side: Secondary | ICD-10-CM

## 2017-06-09 DIAGNOSIS — R200 Anesthesia of skin: Secondary | ICD-10-CM

## 2017-06-09 DIAGNOSIS — R52 Pain, unspecified: Secondary | ICD-10-CM | POA: Diagnosis not present

## 2017-06-09 NOTE — Patient Instructions (Addendum)
Start sciatica rehab exercises (see below) Call for refill of prednisone if your arm tinging does not improve in 2 weeks. Come back and see me if it is still not improving after prednisone.  You will receive a phone call to sched an appt with rheumatology.    Sciatica Rehab Ask your health care provider which exercises are safe for you. Do exercises exactly as told by your health care provider and adjust them as directed. It is normal to feel mild stretching, pulling, tightness, or discomfort as you do these exercises, but you should stop right away if you feel sudden pain or your pain gets worse.Do not begin these exercises until told by your health care provider. Stretching and range of motion exercises These exercises warm up your muscles and joints and improve the movement and flexibility of your hips and your back. These exercises also help to relieve pain, numbness, and tingling. Exercise A: Sciatic nerve glide 1. Sit in a chair with your head facing down toward your chest. Place your hands behind your back. Let your shoulders slump forward. 2. Slowly straighten one of your knees while you tilt your head back as if you are looking toward the ceiling. Only straighten your leg as far as you can without making your symptoms worse. 3. Hold for __________ seconds. 4. Slowly return to the starting position. 5. Repeat with your other leg. Repeat __________ times. Complete this exercise __________ times a day. Exercise B: Knee to chest with hip adduction and internal rotation  1. Lie on your back on a firm surface with both legs straight. 2. Bend one of your knees and move it up toward your chest until you feel a gentle stretch in your lower back and buttock. Then, move your knee toward the shoulder that is on the opposite side from your leg. ? Hold your leg in this position by holding onto the front of your knee. 3. Hold for __________ seconds. 4. Slowly return to the starting  position. 5. Repeat with your other leg. Repeat __________ times. Complete this exercise __________ times a day. Exercise C: Prone extension on elbows  1. Lie on your abdomen on a firm surface. A bed may be too soft for this exercise. 2. Prop yourself up on your elbows. 3. Use your arms to help lift your chest up until you feel a gentle stretch in your abdomen and your lower back. ? This will place some of your body weight on your elbows. If this is uncomfortable, try stacking pillows under your chest. ? Your hips should stay down, against the surface that you are lying on. Keep your hip and back muscles relaxed. 4. Hold for __________ seconds. 5. Slowly relax your upper body and return to the starting position. Repeat __________ times. Complete this exercise __________ times a day. Strengthening exercises These exercises build strength and endurance in your back. Endurance is the ability to use your muscles for a long time, even after they get tired. Exercise D: Pelvic tilt 1. Lie on your back on a firm surface. Bend your knees and keep your feet flat. 2. Tense your abdominal muscles. Tip your pelvis up toward the ceiling and flatten your lower back into the floor. ? To help with this exercise, you may place a small towel under your lower back and try to push your back into the towel. 3. Hold for __________ seconds. 4. Let your muscles relax completely before you repeat this exercise. Repeat __________ times. Complete this exercise  __________ times a day. Exercise E: Alternating arm and leg raises  1. Get on your hands and knees on a firm surface. If you are on a hard floor, you may want to use padding to cushion your knees, such as an exercise mat. 2. Line up your arms and legs. Your hands should be below your shoulders, and your knees should be below your hips. 3. Lift your left leg behind you. At the same time, raise your right arm and straighten it in front of you. ? Do not lift your  leg higher than your hip. ? Do not lift your arm higher than your shoulder. ? Keep your abdominal and back muscles tight. ? Keep your hips facing the ground. ? Do not arch your back. ? Keep your balance carefully, and do not hold your breath. 4. Hold for __________ seconds. 5. Slowly return to the starting position and repeat with your right leg and your left arm. Repeat __________ times. Complete this exercise __________ times a day. Posture and body mechanics  Body mechanics refers to the movements and positions of your body while you do your daily activities. Posture is part of body mechanics. Good posture and healthy body mechanics can help to relieve stress in your body's tissues and joints. Good posture means that your spine is in its natural S-curve position (your spine is neutral), your shoulders are pulled back slightly, and your head is not tipped forward. The following are general guidelines for applying improved posture and body mechanics to your everyday activities. Standing   When standing, keep your spine neutral and your feet about hip-width apart. Keep a slight bend in your knees. Your ears, shoulders, and hips should line up.  When you do a task in which you stand in one place for a long time, place one foot up on a stable object that is 2-4 inches (5-10 cm) high, such as a footstool. This helps keep your spine neutral. Sitting   When sitting, keep your spine neutral and keep your feet flat on the floor. Use a footrest, if necessary, and keep your thighs parallel to the floor. Avoid rounding your shoulders, and avoid tilting your head forward.  When working at a desk or a computer, keep your desk at a height where your hands are slightly lower than your elbows. Slide your chair under your desk so you are close enough to maintain good posture.  When working at a computer, place your monitor at a height where you are looking straight ahead and you do not have to tilt your  head forward or downward to look at the screen. Resting   When lying down and resting, avoid positions that are most painful for you.  If you have pain with activities such as sitting, bending, stooping, or squatting (flexion-based activities), lie in a position in which your body does not bend very much. For example, avoid curling up on your side with your arms and knees near your chest (fetal position).  If you have pain with activities such as standing for a long time or reaching with your arms (extension-based activities), lie with your spine in a neutral position and bend your knees slightly. Try the following positions: ? Lying on your side with a pillow between your knees. ? Lying on your back with a pillow under your knees. Lifting   When lifting objects, keep your feet at least shoulder-width apart and tighten your abdominal muscles.  Bend your knees and hips and keep  your spine neutral. It is important to lift using the strength of your legs, not your back. Do not lock your knees straight out.  Always ask for help to lift heavy or awkward objects. This information is not intended to replace advice given to you by your health care provider. Make sure you discuss any questions you have with your health care provider. Document Released: 04/21/2005 Document Revised: 12/27/2015 Document Reviewed: 01/05/2015 Elsevier Interactive Patient Education  [employer].   Thank you for coming in today. I hope you feel we met your needs.  Feel free to call PCP if you have any questions or further requests.  Please consider signing up for MyChart if you do not already have it, as this is a great way to communicate with me.  Best,  Whitney McVey, PA-C  IF you received an x-ray today, you will receive an invoice from [facility]. Please contact [facility] at [phone] with questions or concerns regarding your invoice.   IF you received labwork today, you will  receive an invoice from [facility]. Please contact [facility] at [phone] with questions or concerns regarding your invoice.   Our billing staff will not be able to assist you with questions regarding bills from these companies.  You will be contacted with the lab results as soon as they are available. The fastest way to get your results is to activate your My Chart account. Instructions are located on the last page of this paperwork. If you have not heard from [place] regarding the results in 2 weeks, please contact this office.

## 2017-06-09 NOTE — Progress Notes (Signed)
Martha [name]  MRN: [id number] DOB: 06-03-1965  PCP: Barton Fanny, MD  Subjective:  Pt is a 52 year old female who presents to clinic for f/u back pain.  She was here 1/18 c/o low back pain radiating down b/l legs. At that time pain was 8/10 and "burning". Xray of her thoracic and lumbar spine was negative. She was Rx prednisone taper and flexeril, advised activity modification.   Today she reports improvement with back pain of 4/10 on a 10 point pain scale. Denies medication side effects.   C/o left arm pain radiating from shoulder to hand x 5 days. +numbness with occasional weakness. Numbness is of pointer finger. Nothing makes it better. No MOI. Denies chest pain, neck pain, chest pressure, shob, fatigue, anxiety.   She would like referral to rheumatology for chronic pain. Pain is present daily all over her body.   Review of Systems  Musculoskeletal: Positive for back pain. Negative for arthralgias, gait problem and joint swelling.  Skin: Negative.   Neurological: Positive for numbness.    Patient Active Problem List   Diagnosis Date Noted  . Anemia, iron deficiency 03/02/2014  . Heme positive stool 03/02/2014  . CTS (carpal tunnel syndrome) 06/27/2012  . Unspecified vitamin D deficiency 03/19/2012  . Multiple joint pain 03/19/2012    Current Outpatient Medications on File Prior to Visit  Medication Sig Dispense Refill  . cyclobenzaprine (FLEXERIL) 10 MG tablet Take 1 tablet (10 mg total) by mouth 3 (three) times daily as needed for muscle spasms. 30 tablet 0  . diclofenac (VOLTAREN) 25 MG EC tablet Take 25 mg by mouth 2 (two) times daily.    Marland Kitchen Fe Cbn-Fe Gluc-FA-B12-C-DSS (FE 90 PLUS PO) Take by mouth.    . Ferrous Sulfate (IRON) 325 (65 Fe) MG TABS Take by mouth.    Marland Kitchen ibuprofen (ADVIL,MOTRIN) 200 MG tablet Take 200 mg by mouth every 6 (six) hours as needed. Reported on 08/22/2015    . Multiple Vitamin (MULTIVITAMIN WITH MINERALS) TABS tablet Take 1 tablet by mouth  daily. Reported on 08/22/2015    . naproxen sodium (ALEVE) 220 MG tablet Take 220 mg by mouth.    . predniSONE (DELTASONE) 20 MG tablet Take 3 PO QAM x4days, 2 PO QAM x4days, 1 PO QAM x4days (Patient not taking: Reported on 06/09/2017) 24 tablet 0   No current facility-administered medications on file prior to visit.     No Known Allergies   Objective:  BP 120/74 (BP Location: Left Arm, Patient Position: Sitting, Cuff Size: Normal)   Pulse 84   Temp 98.7 F (37.1 C) (Oral)   Resp 18   Wt 188 lb (85.3 kg)   LMP 05/31/2017 (Approximate)   SpO2 97%   BMI 31.28 kg/m   Physical Exam  Constitutional: She is oriented to person, place, and time and well-developed, well-nourished, and in no distress. No distress.  Cardiovascular: Normal rate, regular rhythm and normal heart sounds.  Neurological: She is alert and oriented to person, place, and time. She has normal motor skills, normal sensation and normal strength. GCS score is 15.  Skin: Skin is warm and dry.  Psychiatric: Mood, memory, affect and judgment normal.  Vitals reviewed.   Assessment and Plan :  1. Bilateral low back pain with sciatica, sciatica laterality unspecified, unspecified chronicity - Back pain is improving after prednisone taper from last OV. Discussed and printed out sciatica stretches.   2. Left arm numbness - No concerning findings on PE, no muscle  weakness. Suspect cervical radiculopathy. OK to call in prednisone if no improvement in 2 weeks. RTC if symptoms worsen.   3. Pain - Ambulatory referral to Rheumatology - Pt would like fibromyalgia work-up  Mercer Pod, PA-C  [facility] 06/09/2017 8:21 AM

## 2017-09-14 ENCOUNTER — Ambulatory Visit: Payer: Self-pay | Admitting: *Deleted

## 2017-09-14 DIAGNOSIS — Z8719 Personal history of other diseases of the digestive system: Secondary | ICD-10-CM | POA: Insufficient documentation

## 2017-09-14 NOTE — Telephone Encounter (Signed)
Pt states that she has been experiencing dizziness, headache and nausea with feeling weak and having no energy for the past 3 weeks. Pt states that the symptoms have worsened this past week. Pt states she does feel like the room is spinning and it gets worse with turning her head and has nausea with sitting and lying down. Pt denies any chest pain, SOB, Pt states she also goes to the [facility] in [place] and has been taking iron supplements but states it is not helping at this time. Pt wanting to come in for an OV on today but no availability. Pt advised to seek care at Urgent Care for current symptoms. Pt verbalized understanding and states she will go to Urgent Care.   Reason for Disposition . [1] MODERATE dizziness (e.g., interferes with normal activities) AND [2] has NOT been evaluated by physician for this  (Exception: dizziness caused by heat exposure, sudden standing, or poor fluid intake)  Answer Assessment - Initial Assessment Questions 1. DESCRIPTION: "Describe your dizziness."     lightheadeded 2. LIGHTHEADED: "Do you feel lightheaded?" (e.g., somewhat faint, woozy, weak upon standing)     Yes 3. VERTIGO: "Do you feel like either you or the room is spinning or tilting?" (i.e. vertigo)     Yes 4. SEVERITY: "How bad is it?"  "Do you feel like you are going to faint?" "Can you stand and walk?"   - MILD - walking normally   - MODERATE - interferes with normal activities (e.g., work, school)    - SEVERE - unable to stand, requires support to walk, feels like passing out now.      Mild at this time 5. ONSET:  "When did the dizziness begin?"     Approximately 3 weeks ago 6. AGGRAVATING FACTORS: "Does anything make it worse?" (e.g., standing, change in head position)    Changing head positions 7. HEART RATE: "Can you tell me your heart rate?" "How many beats in 15 seconds?"  (Note: not all patients can do this)       Palpitations on occasions 8. CAUSE: "What do you think is causing the  dizziness?"     Unsure 9. RECURRENT SYMPTOM: "Have you had dizziness before?" If so, ask: "When was the last time?" "What happened that time?"     No 10. OTHER SYMPTOMS: "Do you have any other symptoms?" (e.g., fever, chest pain, vomiting, diarrhea, bleeding)       Nausea 11. PREGNANCY: "Is there any chance you are pregnant?" "When was your last menstrual period?"       No pregnancy, menopausal  Protocols used: DIZZINESS Heidi Dach

## 2018-05-11 ENCOUNTER — Encounter (HOSPITAL_COMMUNITY): Payer: Self-pay | Admitting: Emergency Medicine

## 2018-05-11 ENCOUNTER — Emergency Department (HOSPITAL_COMMUNITY)
Admission: EM | Admit: 2018-05-11 | Discharge: 2018-05-11 | Disposition: A | Discharge status: Home / Self Care | Payer: 59 | Attending: Emergency Medicine | Admitting: Emergency Medicine

## 2018-05-11 ENCOUNTER — Other Ambulatory Visit: Payer: Self-pay

## 2018-05-11 ENCOUNTER — Ambulatory Visit (HOSPITAL_COMMUNITY)
Admission: EM | Admit: 2018-05-11 | Discharge: 2018-05-11 | Disposition: A | Discharge status: Home / Self Care | Payer: 59 | Source: Home / Self Care

## 2018-05-11 ENCOUNTER — Emergency Department (HOSPITAL_COMMUNITY): Payer: 59

## 2018-05-11 DIAGNOSIS — Z79899 Other long term (current) drug therapy: Secondary | ICD-10-CM | POA: Insufficient documentation

## 2018-05-11 DIAGNOSIS — H538 Other visual disturbances: Secondary | ICD-10-CM | POA: Diagnosis not present

## 2018-05-11 DIAGNOSIS — Z87891 Personal history of nicotine dependence: Secondary | ICD-10-CM | POA: Insufficient documentation

## 2018-05-11 DIAGNOSIS — R200 Anesthesia of skin: Secondary | ICD-10-CM | POA: Diagnosis not present

## 2018-05-11 DIAGNOSIS — R531 Weakness: Secondary | ICD-10-CM | POA: Diagnosis present

## 2018-05-11 DIAGNOSIS — R202 Paresthesia of skin: Secondary | ICD-10-CM

## 2018-05-11 HISTORY — DX: Post-traumatic stress disorder, unspecified: F43.10

## 2018-05-11 LAB — CBC
HCT: 39.4 % (ref 36.0–46.0)
HEMOGLOBIN: 11.9 g/dL — AB (ref 12.0–15.0)
MCH: 24.5 pg — ABNORMAL LOW (ref 26.0–34.0)
MCHC: 30.2 g/dL (ref 30.0–36.0)
MCV: 81.1 fL (ref 80.0–100.0)
NRBC: 0 % (ref 0.0–0.2)
Platelets: 365 10*3/uL (ref 150–400)
RBC: 4.86 MIL/uL (ref 3.87–5.11)
RDW: 12.5 % (ref 11.5–15.5)
WBC: 6.6 10*3/uL (ref 4.0–10.5)

## 2018-05-11 LAB — BASIC METABOLIC PANEL
ANION GAP: 8 (ref 5–15)
BUN: 8 mg/dL (ref 6–20)
CALCIUM: 9.4 mg/dL (ref 8.9–10.3)
CHLORIDE: 105 mmol/L (ref 98–111)
CO2: 26 mmol/L (ref 22–32)
Creatinine, Ser: 0.77 mg/dL (ref 0.44–1.00)
GFR calc non Af Amer: >60 mL/min (ref >60)
Glucose, Bld: 125 mg/dL — ABNORMAL HIGH (ref 70–99)
Potassium: 3.6 mmol/L (ref 3.5–5.1)
SODIUM: 139 mmol/L (ref 135–145)

## 2018-05-11 LAB — CBG MONITORING, ED: GLUCOSE-CAPILLARY: 92 mg/dL (ref 70–99)

## 2018-05-11 LAB — I-STAT BETA HCG BLOOD, ED (MC, WL, AP ONLY): I-stat hCG, quantitative: <5 m[IU]/mL (ref <5)

## 2018-05-11 MED ORDER — GADOBUTROL 1 MMOL/ML IV SOLN
8.0000 mL | Freq: Once | INTRAVENOUS | Status: AC | PRN
  Administered 2018-05-11: 8 mL via INTRAVENOUS

## 2018-05-11 MED ORDER — LORAZEPAM 2 MG/ML IJ SOLN
1.0000 mg | Freq: Once | INTRAMUSCULAR | Status: AC
  Administered 2018-05-11: 1 mg via INTRAVENOUS
  Filled 2018-05-11: qty 1

## 2018-05-11 NOTE — ED Triage Notes (Addendum)
Pt with right side arm numbness and right facial numbness with blurred vision since last night at 2130. She reports that the left side goes numb from time to time as well. She is speaking clear sentences, equal grips and no facial drooping on assessment. She denies sob, or chest pain. A/o at triage.

## 2018-05-11 NOTE — ED Provider Notes (Signed)
[facility] Provider Note   CSN: [id number] Arrival date & time: 05/11/18  1152     History   Chief Complaint Chief Complaint  Patient presents with  . Numbness  . Blurred Vision  . Weakness    HPI Martha [name] is a 53 y.o. female.  The history is provided by the patient.  Weakness  Severity:  Mild Onset quality:  Gradual Duration:  12 hours Timing:  Constant Progression:  Worsening Chronicity:  New Context: not alcohol use   Relieved by:  None tried Worsened by:  Nothing Ineffective treatments:  None tried Associated symptoms: no abdominal pain and no falls     Past Medical History:  Diagnosis Date  . Arthritis   . GERD (gastroesophageal reflux disease)   . PTSD (post-traumatic stress disorder)   . Tubular adenoma of colon   . Ulcer 05/05/2010   Peptic Ulcer/ H. Pylori +  . Vitamin D deficiency     Patient Active Problem List   Diagnosis Date Noted  . Anemia, iron deficiency 03/02/2014  . Heme positive stool 03/02/2014  . CTS (carpal tunnel syndrome) 06/27/2012  . Unspecified vitamin D deficiency 03/19/2012  . Multiple joint pain 03/19/2012    Past Surgical History:  Procedure Laterality Date  . COLONOSCOPY    . DEEP NECK LYMPH NODE BIOPSY / EXCISION    . ESOPHAGOGASTRODUODENOSCOPY  05/05/2010   +ulcer; +h. Pylori  . FOOT SURGERY    . TONSILLECTOMY       OB History   No obstetric history on file.      Home Medications    Prior to Admission medications   Medication Sig Start Date End Date Taking? Authorizing Provider  Ferrous Sulfate (IRON) 325 (65 Fe) MG TABS Take 325 mg by mouth 3 (three) times a week.    Yes [provider]  ibuprofen (ADVIL,MOTRIN) 200 MG tablet Take 200 mg by mouth every 6 (six) hours as needed. Reported on 08/22/2015   Yes [provider]  Multiple Vitamin (MULTIVITAMIN WITH MINERALS) TABS tablet Take 1 tablet by mouth daily. Reported on 08/22/2015   Yes [provider]  naproxen sodium (ALEVE) 220 MG tablet Take 660 mg by mouth as needed (pain).   Yes [provider]    Family History Family History  Problem Relation Age of Onset  . Anemia Mother   . Hyperlipidemia Mother   . Hypertension Father   . Hyperlipidemia Father   . Asthma Sister   . Hypertension Sister   . Hyperlipidemia Sister   . Multiple sclerosis Maternal Grandfather   . Gout Paternal Grandfather   . Liver disease Paternal Grandfather   . Colon cancer Neg Hx     Social History Social History   Tobacco Use  . Smoking status: Former Smoker    Last attempt to quit: 12/07/1989    Years since quitting: 28.4  . Smokeless tobacco: Never Used  Substance Use Topics  . Alcohol use: Yes    Alcohol/week: 1.0 standard drinks    Types: 1 Standard drinks or equivalent per week    Comment: 1-2 / week  . Drug use: No     Allergies   Patient has no known allergies.   Review of Systems Review of Systems  Gastrointestinal: Negative for abdominal pain.  Musculoskeletal: Negative for falls.  Neurological: Positive for weakness.  All other systems reviewed and are negative.    Physical Exam Updated Vital Signs BP (!) 127/94 (BP  Location: Right Arm)   Pulse 71   Temp 98.9 F (37.2 C) (Oral)   Resp 16   Ht 5\' 5"  (1.651 m)   Wt 88 kg   SpO2 100%   BMI 32.28 kg/m   Physical Exam Vitals signs and nursing note reviewed.  Constitutional:      Appearance: She is well-developed.  HENT:     Head: Normocephalic and atraumatic.     Mouth/Throat:     Mouth: Mucous membranes are moist.  Neck:     Musculoskeletal: Normal range of motion.  Cardiovascular:     Rate and Rhythm: Normal rate and regular rhythm.  Pulmonary:     Effort: Pulmonary effort is normal. No respiratory distress.     Breath sounds: No stridor.  Abdominal:     General: There is no distension.  Musculoskeletal: Normal range of motion.  Skin:    General: Skin is warm and dry.    Neurological:     Mental Status: She is alert and oriented to person, place, and time.     Cranial Nerves: Cranial nerve deficit (decreased sensation on right) present.     Sensory: Sensory deficit (total body and face, R>L) present.     Motor: Weakness (slightly diminished on right, wonder if it is volitional) present.     Deep Tendon Reflexes: Reflexes normal.      ED Treatments / Results  Labs (all labs ordered are listed, but only abnormal results are displayed) Labs Reviewed  BASIC METABOLIC PANEL - Abnormal; Notable for the following components:      Result Value   Glucose, Bld 125 (*)    All other components within normal limits  CBC - Abnormal; Notable for the following components:   Hemoglobin 11.9 (*)    MCH 24.5 (*)    All other components within normal limits  CBG MONITORING, ED  I-STAT BETA HCG BLOOD, ED ([facility], [facility], [facility] ONLY)    EKG None  Radiology Mr Jeri Cos And Wo Contrast  Result Date: 05/11/2018 CLINICAL DATA:  53 y/o F; right-sided arm numbness and right facial numbness with blurred vision since last night at 2130 hours. Intermittent left-sided numbness as well. EXAM: MRI HEAD WITHOUT AND WITH CONTRAST TECHNIQUE: Multiplanar, multiecho pulse sequences of the brain and surrounding structures were obtained without and with intravenous contrast. CONTRAST:  8 cc Gadavist COMPARISON:  None. FINDINGS: Brain: No acute infarction, hemorrhage, hydrocephalus, extra-axial collection or mass lesion. After administration of intravenous contrast there is no abnormal enhancement of the brain. No significant structural or signal abnormality identified given age. Vascular: Normal flow voids. Skull and upper cervical spine: Normal marrow signal. Sinuses/Orbits: Negative. Other: None. IMPRESSION: No acute intracranial process or abnormal enhancement identified. Unremarkable MRI of the brain for age. Electronically Signed   By: Kristine Garbe M.D.   On: 05/11/2018 16:23     Procedures Procedures (including critical care time)  Medications Ordered in ED Medications  LORazepam (ATIVAN) injection 1 mg (1 mg Intravenous Given 05/11/18 1432)  gadobutrol (GADAVIST) 1 MMOL/ML injection 8 mL (8 mLs Intravenous Contrast Given 05/11/18 1601)     Initial Impression / Assessment and Plan / ED Course  I have reviewed the triage vital signs and the nursing notes.  Pertinent labs & imaging results that were available during my care of the patient were reviewed by me and considered in my medical decision making (see chart for details).     Atypical presentation for actual neurologic disorder, however after discussion  with neurology, Dr. Lucky Rathke, will order MRI brain w/wo to eval for cva vs MS. If normal, can continue follow up with outpatient doctors.   Final Clinical Impressions(s) / ED Diagnoses   Final diagnoses:  Paresthesia    ED Discharge Orders    None       Delanna Blacketer, Corene Cornea, MD 05/12/18 1327

## 2018-05-11 NOTE — ED Triage Notes (Signed)
Pt states yesterday she started having numbness on R side of face. States its also a little on the left, and some numbness on the left arm. No chest pain. Spoke with [facility] PA, we do not have testing capabilities here to rule out stroke, pt agreeable to be seen in ER. Left for ER.

## 2018-05-11 NOTE — ED Notes (Signed)
Patient verbalizes understanding of discharge instructions. Opportunity for questioning and answers were provided. Armband removed by staff, pt discharged from ED.  

## 2018-05-11 NOTE — ED Notes (Signed)
Patient transported to MRI 

## 2018-12-01 ENCOUNTER — Other Ambulatory Visit: Payer: Self-pay | Admitting: Podiatry

## 2018-12-01 ENCOUNTER — Encounter: Payer: Self-pay | Admitting: Podiatry

## 2018-12-01 ENCOUNTER — Other Ambulatory Visit: Payer: Self-pay

## 2018-12-01 ENCOUNTER — Ambulatory Visit (INDEPENDENT_AMBULATORY_CARE_PROVIDER_SITE_OTHER): Payer: 59

## 2018-12-01 ENCOUNTER — Ambulatory Visit: Payer: 59 | Admitting: Podiatry

## 2018-12-01 VITALS — BP 143/92 | HR 90 | Temp 97.8°F

## 2018-12-01 DIAGNOSIS — M25571 Pain in right ankle and joints of right foot: Secondary | ICD-10-CM

## 2018-12-01 DIAGNOSIS — M722 Plantar fascial fibromatosis: Secondary | ICD-10-CM

## 2018-12-01 DIAGNOSIS — M25572 Pain in left ankle and joints of left foot: Secondary | ICD-10-CM

## 2018-12-01 DIAGNOSIS — M659 Synovitis and tenosynovitis, unspecified: Secondary | ICD-10-CM | POA: Diagnosis not present

## 2018-12-01 DIAGNOSIS — M79672 Pain in left foot: Secondary | ICD-10-CM

## 2018-12-01 DIAGNOSIS — M79671 Pain in right foot: Secondary | ICD-10-CM

## 2018-12-01 DIAGNOSIS — M65979 Unspecified synovitis and tenosynovitis, unspecified ankle and foot: Secondary | ICD-10-CM

## 2018-12-01 MED ORDER — DICLOFENAC SODIUM 75 MG PO TBEC: 75.0000 mg | DELAYED_RELEASE_TABLET | Freq: Two times a day (BID) | ORAL | 2 refills | Status: DC

## 2018-12-01 NOTE — Progress Notes (Signed)
Subjective:   Patient ID: Martha [name], female   DOB: 53 y.o.   MRN: [id number]   HPI Patient presents stating the bottom of the heels have really been bothering her and also into the arch.  Patient states that this is gradually become more of an issue for her and the heels seem to be the worse now.  Patient does not smoke likes to be active   Review of Systems  All other systems reviewed and are negative.       Objective:  Physical Exam Vitals signs and nursing note reviewed.  Constitutional:      Appearance: She is well-developed.  Pulmonary:     Effort: Pulmonary effort is normal.  Musculoskeletal: Normal range of motion.  Skin:    General: Skin is warm.  Neurological:     Mental Status: She is alert.     Neurovascular status intact muscle strength is adequate range of motion within normal limits.  Patient is found to have discomfort of his exquisite nature in the plantar aspect of the heel bilateral with inflammation also the forefoot around the metatarsal phalangeal joints.  Patient has good digital perfusion well oriented x3     Assessment:  Acute fasciitis bilateral with inflammation fluid along with moderate forefoot capsulitis which I am hoping is compensatory     Plan:  H&P condition reviewed and today I did sterile prep injected the plantar fascial bilateral 3 mg Kenalog 5 mg Xylocaine applied fascial brace bilateral and placed on anti-inflammatory diclofenac 75 mg twice daily.  Reappoint to recheck 2 weeks and may require other treatments  X-ray indicates no indications of spur or no indications of advanced arthritis stress fracture

## 2018-12-01 NOTE — Patient Instructions (Signed)

## 2018-12-15 ENCOUNTER — Encounter: Payer: Self-pay | Admitting: Podiatry

## 2018-12-15 ENCOUNTER — Other Ambulatory Visit: Payer: Self-pay

## 2018-12-15 ENCOUNTER — Ambulatory Visit: Payer: 59 | Admitting: Podiatry

## 2018-12-15 VITALS — Temp 97.2°F

## 2018-12-15 DIAGNOSIS — M722 Plantar fascial fibromatosis: Secondary | ICD-10-CM

## 2018-12-15 DIAGNOSIS — M779 Enthesopathy, unspecified: Secondary | ICD-10-CM

## 2018-12-15 NOTE — Progress Notes (Signed)
Subjective:   Patient ID: Martha [name], female   DOB: 53 y.o.   MRN: [id number]   HPI Patient states that the heels seem quite a bit better but still some discomfort in the arch and forefoot and she does not have enough support   ROS      Objective:  Physical Exam  Neurovascular status intact with diminished discomfort in the heel region bilateral with inflammation still noted upon deep palpation but overall improved with quite a bit of pain into the arch and forefoot bilateral     Assessment:  Fasciitis-like symptoms which have improved in the heel with discomfort in the arch region bilateral and into the forefoot bilateral with inflammation around the metatarsal phalangeal joints of both feet     Plan:  Improved insertional fasciitis bilateral with moderate mid arch fasciitis capsulitis bilateral that is still bothersome for her.  I have recommended long-term orthotics to try to disperse the weightbearing pattern and try to reduce the stress on her feet and she will get approval for this and I do think this will be of benefit to her

## 2019-04-21 ENCOUNTER — Encounter: Payer: Self-pay | Admitting: Internal Medicine

## 2019-05-30 ENCOUNTER — Telehealth: Payer: Self-pay | Admitting: Diagnostic Neuroimaging

## 2019-05-30 NOTE — Telephone Encounter (Signed)
I called patient and LVM regarding rescheduling 1/28 NCV/EMG due to tech out. Requested patient call back to reschedule.

## 2019-06-02 ENCOUNTER — Encounter: Payer: 59 | Admitting: Diagnostic Neuroimaging

## 2019-07-14 ENCOUNTER — Encounter (INDEPENDENT_AMBULATORY_CARE_PROVIDER_SITE_OTHER): Payer: 59 | Admitting: Diagnostic Neuroimaging

## 2019-07-14 ENCOUNTER — Other Ambulatory Visit: Payer: Self-pay

## 2019-07-14 ENCOUNTER — Ambulatory Visit (INDEPENDENT_AMBULATORY_CARE_PROVIDER_SITE_OTHER): Payer: 59 | Admitting: Diagnostic Neuroimaging

## 2019-07-14 DIAGNOSIS — M79604 Pain in right leg: Secondary | ICD-10-CM

## 2019-07-14 DIAGNOSIS — M79605 Pain in left leg: Secondary | ICD-10-CM | POA: Diagnosis not present

## 2019-07-14 DIAGNOSIS — Z0289 Encounter for other administrative examinations: Secondary | ICD-10-CM

## 2019-07-20 NOTE — Procedures (Signed)
[facility]  NCS (NERVE CONDUCTION STUDY) WITH EMG (ELECTROMYOGRAPHY) REPORT   STUDY DATE: 07/14/19 PATIENT NAME: Martha [name] DOB: 04-May-1966 MRN: [id number]  ORDERING CLINICIAN: Wilber Bihari, MD  TECHNOLOGIST: Sherre Scarlet ELECTROMYOGRAPHER: Earlean Polka. Nolia Tschantz, MD  CLINICAL INFORMATION: 54 year old female with low back pain and lower extremity pain.  FINDINGS: NERVE CONDUCTION STUDY:  Bilateral tibial and peroneal motor responses are normal.  Bilateral sural and superficial peroneal sensory responses are normal.  Bilateral tibial F wave latencies are normal.   NEEDLE ELECTROMYOGRAPHY:  Needle examination of left lower extremity and left lumbar paraspinal muscles is normal.   IMPRESSION:   Normal study.  No electrodiagnostic evidence of large fiber neuropathy or lumbar radiculopathy at this time.    INTERPRETING PHYSICIAN:  Penni Bombard, MD Certified in Neurology, Neurophysiology and Neuroimaging  [facility] [address] [place], [place] [address] [phone]   [facility]    Nerve / Sites Muscle Latency Ref. Amplitude Ref. Rel Amp Segments Distance Velocity Ref. Area    ms ms mV mV %  cm m/s m/s mVms  R Peroneal - EDB     Ankle EDB 4.2 ?6.5 5.7 ?2.0 100 Ankle - EDB 9   15.1     Fib head EDB 10.5  5.7  100 Fib head - Ankle 30 48 ?44 17.9     Pop fossa EDB 12.5  4.8  83.5 Pop fossa - Fib head 10 50 ?44 14.3         Pop fossa - Ankle      L Peroneal - EDB     Ankle EDB 4.1 ?6.5 8.0 ?2.0 100 Ankle - EDB 9   18.7     Fib head EDB 10.2  7.5  94.7 Fib head - Ankle 29 47 ?44 19.1     Pop fossa EDB 12.3  7.4  97.7 Pop fossa - Fib head 10 47 ?44 18.6         Pop fossa - Ankle      R Tibial - AH     Ankle AH 4.3 ?5.8 14.6 ?4.0 100 Ankle - AH 9   34.1     Pop fossa AH 12.6  10.1  69.6 Pop fossa - Ankle 37 45 ?41 28.8  L Tibial - AH     Ankle AH 4.2 ?5.8 15.7 ?4.0 100 Ankle - AH 9   37.9     Pop fossa AH 12.4  13.8  88.2  Pop fossa - Ankle 37 45 ?41 37.8             SNC    Nerve / Sites Rec. Site Peak Lat Ref.  Amp Ref. Segments Distance    ms ms V V  cm  R Sural - Ankle (Calf)     Calf Ankle 3.4 ?4.4 22 ?6 Calf - Ankle 14  L Sural - Ankle (Calf)     Calf Ankle 4.1 ?4.4 20 ?6 Calf - Ankle 14  R Superficial peroneal - Ankle     Lat leg Ankle 3.8 ?4.4 9 ?6 Lat leg - Ankle 14  L Superficial peroneal - Ankle     Lat leg Ankle 3.8 ?4.4 8 ?6 Lat leg - Ankle 14             F  Wave    Nerve F Lat Ref.   ms ms  R Tibial - AH 50.9 ?56.0  L Tibial - AH 48.6 ?56.0  EMG Summary Table    Spontaneous MUAP Recruitment  Muscle IA Fib PSW Fasc Other Amp Dur. Poly Pattern  L. Vastus medialis Normal None None None _______ Normal Normal Normal Normal  L. Tibialis anterior Normal None None None _______ Normal Normal Normal Normal  L. Gastrocnemius (Medial head) Normal None None None _______ Normal Normal Normal Normal  L. Iliopsoas Normal None None None _______ Normal Normal Normal Normal  L. Gluteus medius Normal None None None _______ Normal Normal Normal Normal  L. Lumbar paraspinals Normal None None None _______ Normal Normal Normal Normal

## 2020-01-05 ENCOUNTER — Other Ambulatory Visit: Payer: Self-pay

## 2020-01-05 ENCOUNTER — Ambulatory Visit
Admission: EM | Admit: 2020-01-05 | Discharge: 2020-01-05 | Disposition: A | Discharge status: Home / Self Care | Payer: Non-veteran care | Attending: Emergency Medicine | Admitting: Emergency Medicine

## 2020-01-05 DIAGNOSIS — M546 Pain in thoracic spine: Secondary | ICD-10-CM

## 2020-01-05 DIAGNOSIS — J0100 Acute maxillary sinusitis, unspecified: Secondary | ICD-10-CM

## 2020-01-05 LAB — POCT URINALYSIS DIP (MANUAL ENTRY)
Bilirubin, UA: NEGATIVE
Blood, UA: NEGATIVE
Glucose, UA: NEGATIVE mg/dL
Ketones, POC UA: NEGATIVE mg/dL
Nitrite, UA: NEGATIVE
Protein Ur, POC: NEGATIVE mg/dL
Spec Grav, UA: >=1.03 — AB (ref 1.010–1.025)
Urobilinogen, UA: 1 E.U./dL
pH, UA: 7 (ref 5.0–8.0)

## 2020-01-05 MED ORDER — NAPROXEN 500 MG PO TABS: 500.0000 mg | ORAL_TABLET | Freq: Two times a day (BID) | ORAL | 0 refills | Status: DC

## 2020-01-05 MED ORDER — CYCLOBENZAPRINE HCL 5 MG PO TABS: 5.0000 mg | ORAL_TABLET | Freq: Two times a day (BID) | ORAL | 0 refills | Status: AC | PRN

## 2020-01-05 MED ORDER — AMOXICILLIN-POT CLAVULANATE 875-125 MG PO TABS: 1.0000 | ORAL_TABLET | Freq: Two times a day (BID) | ORAL | 0 refills | Status: DC

## 2020-01-05 MED ORDER — DICLOFENAC SODIUM 1 % EX GEL: 2.0000 g | Freq: Four times a day (QID) | CUTANEOUS | 0 refills | Status: DC

## 2020-01-05 NOTE — Discharge Instructions (Addendum)

## 2020-01-05 NOTE — ED Triage Notes (Signed)
Pt c/o mid to upper back pain/burning radiating across bilateral flank to mid abdomen since Sunday. Denies know injury. Denies urinary difficulties but states has slight odor. States was treated for a sinus infection last week and had a neg covid test.

## 2020-01-05 NOTE — ED Provider Notes (Signed)
[facility]    CSN: [id number] Arrival date & time: 01/05/20  1000      History   Chief Complaint Chief Complaint  Patient presents with  . Back Pain  . Otalgia    HPI Martha [name] is a 54 y.o. female   Presenting for multiple concerns: Endorsing bilateral thoracic back pain that is dull, aching, radiates across to her sides since Sunday.  Denies trauma/injury to the affected area and does not recall an inciting event.  Denies fever, saddle area anesthesia, lower extremity numbness/weakness, urinary retention, fecal incontinence. Patient also notes persistent sinus congestion and pressure with maxillary sinus pain.  Was seen at the [facility] same-day clinic last week: Given supportive care.  States has been compliant with this, using Flonase as well since Monday with persistent symptoms.  Now having almost 2 weeks of symptoms.  No fever, cough.  Underwent Covid testing: negative.  Past Medical History:  Diagnosis Date  . Arthritis   . GERD (gastroesophageal reflux disease)   . PTSD (post-traumatic stress disorder)   . Tubular adenoma of colon   . Ulcer 05/05/2010   Peptic Ulcer/ H. Pylori +  . Vitamin D deficiency     Patient Active Problem List   Diagnosis Date Noted  . Anemia, iron deficiency 03/02/2014  . Heme positive stool 03/02/2014  . CTS (carpal tunnel syndrome) 06/27/2012  . Unspecified vitamin D deficiency 03/19/2012  . Multiple joint pain 03/19/2012    Past Surgical History:  Procedure Laterality Date  . COLONOSCOPY    . DEEP NECK LYMPH NODE BIOPSY / EXCISION    . ESOPHAGOGASTRODUODENOSCOPY  05/05/2010   +ulcer; +h. Pylori  . FOOT SURGERY    . TONSILLECTOMY      OB History   No obstetric history on file.      Home Medications    Prior to Admission medications   Medication Sig Start Date End Date Taking? Authorizing Provider  amoxicillin-clavulanate (AUGMENTIN) 875-125 MG tablet Take 1 tablet by mouth every 12 (twelve) hours. 01/05/20    Hall-Potvin, [place], PA-C  cyclobenzaprine (FLEXERIL) 5 MG tablet Take 1 tablet (5 mg total) by mouth 2 (two) times daily as needed for up to 7 days for muscle spasms. 01/05/20 01/12/20  Hall-Potvin, [place], PA-C  diclofenac (VOLTAREN) 75 MG EC tablet Take 1 tablet (75 mg total) by mouth 2 (two) times daily. 12/01/18   Wallene Huh, DPM  diclofenac Sodium (VOLTAREN) 1 % GEL Apply 2 g topically 4 (four) times daily. 01/05/20   Hall-Potvin, [place], PA-C  Ferrous Sulfate (IRON) 325 (65 Fe) MG TABS Take 325 mg by mouth 3 (three) times a week.     [provider]  ibuprofen (ADVIL,MOTRIN) 200 MG tablet Take 200 mg by mouth every 6 (six) hours as needed. Reported on 08/22/2015    [provider]  Multiple Vitamin (MULTIVITAMIN WITH MINERALS) TABS tablet Take 1 tablet by mouth daily. Reported on 08/22/2015    [provider]  naproxen (NAPROSYN) 500 MG tablet Take 1 tablet (500 mg total) by mouth 2 (two) times daily. 01/05/20   Hall-Potvin, [place], PA-C    Family History Family History  Problem Relation Age of Onset  . Anemia Mother   . Hyperlipidemia Mother   . Hypertension Father   . Hyperlipidemia Father   . Asthma Sister   . Hypertension Sister   . Hyperlipidemia Sister   . Multiple sclerosis Maternal Grandfather   . Gout Paternal Grandfather   . Liver disease  Paternal Grandfather   . Colon cancer Neg Hx     Social History Social History   Tobacco Use  . Smoking status: Former Smoker    Quit date: 12/07/1989    Years since quitting: 30.0  . Smokeless tobacco: Never Used  Vaping Use  . Vaping Use: Never used  Substance Use Topics  . Alcohol use: Yes    Alcohol/week: 1.0 standard drink    Types: 1 Standard drinks or equivalent per week    Comment: 1-2 / week  . Drug use: No     Allergies   Patient has no known allergies.   Review of Systems As per HPI   Physical Exam Triage Vital Signs ED Triage Vitals  Enc Vitals Group     BP 01/05/20 1215  127/84     Pulse Rate 01/05/20 1215 75     Resp 01/05/20 1215 18     Temp 01/05/20 1215 98.1 F (36.7 C)     Temp Source 01/05/20 1215 Oral     SpO2 01/05/20 1215 98 %     Weight --      Height --      Head Circumference --      Peak Flow --      Pain Score 01/05/20 1216 5     Pain Loc --      Pain Edu? --      Excl. in [place]? --    No data found.  Updated Vital Signs BP 127/84 (BP Location: Left Arm)   Pulse 75   Temp 98.1 F (36.7 C) (Oral)   Resp 18   SpO2 98%   Visual Acuity Right Eye Distance:   Left Eye Distance:   Bilateral Distance:    Right Eye Near:   Left Eye Near:    Bilateral Near:     Physical Exam Constitutional:      General: She is not in acute distress. HENT:     Head: Normocephalic and atraumatic.  Eyes:     General: No scleral icterus.    Pupils: Pupils are equal, round, and reactive to light.  Cardiovascular:     Rate and Rhythm: Normal rate.  Pulmonary:     Effort: Pulmonary effort is normal.  Musculoskeletal:        General: Tenderness present. No swelling. Normal range of motion.     Comments: Mild, bilateral thoracic tenderness with muscle knots.  No spasm, crepitus, erythema or warmth.  Skin:    Coloration: Skin is not jaundiced or pale.     Findings: No bruising or rash.  Neurological:     General: No focal deficit present.     Mental Status: She is alert and oriented to person, place, and time.      UC Treatments / Results  Labs (all labs ordered are listed, but only abnormal results are displayed) Labs Reviewed  POCT URINALYSIS DIP (MANUAL ENTRY) - Abnormal; Notable for the following components:      Result Value   Spec Grav, UA >=1.030 (*)    Leukocytes, UA Trace (*)    All other components within normal limits    EKG   Radiology No results found.  Procedures Procedures (including critical care time)  Medications Ordered in UC Medications - No data to display  Initial Impression / Assessment and Plan / UC  Course  I have reviewed the triage vital signs and the nursing notes.  Pertinent labs & imaging results that were available during my  care of the patient were reviewed by me and considered in my medical decision making (see chart for details).     Patient appears well in office today.  Back pain likely MSK: We will treat supportively as outlined below.  Provided orthopedic follow-up information if needed. Patient has approximately 2-week course of severe/worsening nasal congestion and sinus pressure that has been refractory to OTC medications.  Will start Augmentin.  Return precautions discussed, pt verbalized understanding and is agreeable to plan. Final Clinical Impressions(s) / UC Diagnoses   Final diagnoses:  Acute bilateral thoracic back pain  Acute maxillary sinusitis, recurrence not specified     Discharge Instructions     Heat therapy (hot compress, warm wash rag, hot showers, etc.) can help relax muscles and soothe muscle aches. Cold therapy (ice packs) can be used to help swelling both after injury and after prolonged use of areas of chronic pain/aches.  Pain medication: 500 mg Naprosyn/Aleve (naproxen) every 12 hours with food:  AVOID other NSAIDs while taking this (may have Tylenol).  May take muscle relaxer as needed for severe pain / spasm.  (This medication may cause you to become tired so it is important you do not drink alcohol or operate heavy machinery while on this medication.  Recommend your first dose to be taken before bedtime to monitor for side effects safely)  Important to follow up with specialist(s) below for further evaluation/management if your symptoms persist or worsen.    ED Prescriptions    Medication Sig Dispense Auth. Provider   amoxicillin-clavulanate (AUGMENTIN) 875-125 MG tablet Take 1 tablet by mouth every 12 (twelve) hours. 14 tablet Hall-Potvin, [place], PA-C   naproxen (NAPROSYN) 500 MG tablet Take 1 tablet (500 mg total) by mouth 2 (two)  times daily. 30 tablet Hall-Potvin, [place], PA-C   diclofenac Sodium (VOLTAREN) 1 % GEL Apply 2 g topically 4 (four) times daily. 100 g Hall-Potvin, [place], PA-C   cyclobenzaprine (FLEXERIL) 5 MG tablet Take 1 tablet (5 mg total) by mouth 2 (two) times daily as needed for up to 7 days for muscle spasms. 14 tablet Hall-Potvin, [place], PA-C     I have reviewed the PDMP during this encounter.   Hall-Potvin, [place], [place] 01/05/20 1317

## 2020-01-21 ENCOUNTER — Emergency Department (HOSPITAL_COMMUNITY): Payer: No Typology Code available for payment source

## 2020-01-21 ENCOUNTER — Other Ambulatory Visit: Payer: Self-pay

## 2020-01-21 ENCOUNTER — Emergency Department (HOSPITAL_COMMUNITY)
Admission: EM | Admit: 2020-01-21 | Discharge: 2020-01-21 | Disposition: A | Discharge status: Home / Self Care | Payer: No Typology Code available for payment source | Attending: Emergency Medicine | Admitting: Emergency Medicine

## 2020-01-21 ENCOUNTER — Encounter (HOSPITAL_COMMUNITY): Payer: Self-pay | Admitting: *Deleted

## 2020-01-21 DIAGNOSIS — Z79899 Other long term (current) drug therapy: Secondary | ICD-10-CM | POA: Insufficient documentation

## 2020-01-21 DIAGNOSIS — Z87891 Personal history of nicotine dependence: Secondary | ICD-10-CM | POA: Insufficient documentation

## 2020-01-21 DIAGNOSIS — R519 Headache, unspecified: Secondary | ICD-10-CM | POA: Diagnosis not present

## 2020-01-21 DIAGNOSIS — R079 Chest pain, unspecified: Secondary | ICD-10-CM | POA: Diagnosis not present

## 2020-01-21 LAB — TROPONIN I (HIGH SENSITIVITY)
Troponin I (High Sensitivity): 2 ng/L (ref <18)
Troponin I (High Sensitivity): 2 ng/L

## 2020-01-21 LAB — CBC
HCT: 40.6 % (ref 36.0–46.0)
Hemoglobin: 12.4 g/dL (ref 12.0–15.0)
MCH: 25.7 pg — ABNORMAL LOW (ref 26.0–34.0)
MCHC: 30.5 g/dL (ref 30.0–36.0)
MCV: 84.2 fL (ref 80.0–100.0)
Platelets: 311 10*3/uL (ref 150–400)
RBC: 4.82 MIL/uL (ref 3.87–5.11)
RDW: 13.4 % (ref 11.5–15.5)
WBC: 6.7 10*3/uL (ref 4.0–10.5)
nRBC: 0 % (ref 0.0–0.2)

## 2020-01-21 LAB — I-STAT BETA HCG BLOOD, ED (MC, WL, AP ONLY): I-stat hCG, quantitative: <5 m[IU]/mL (ref <5)

## 2020-01-21 LAB — BASIC METABOLIC PANEL WITH GFR
Anion gap: 7 (ref 5–15)
BUN: 5 mg/dL — ABNORMAL LOW (ref 6–20)
CO2: 28 mmol/L (ref 22–32)
Calcium: 9.6 mg/dL (ref 8.9–10.3)
Chloride: 104 mmol/L (ref 98–111)
Creatinine, Ser: 0.71 mg/dL (ref 0.44–1.00)
GFR calc Af Amer: >60 mL/min
GFR calc non Af Amer: >60 mL/min
Glucose, Bld: 111 mg/dL — ABNORMAL HIGH (ref 70–99)
Potassium: 4.1 mmol/L (ref 3.5–5.1)
Sodium: 139 mmol/L (ref 135–145)

## 2020-01-21 MED ORDER — OXYCODONE-ACETAMINOPHEN 5-325 MG PO TABS
1.0000 | ORAL_TABLET | Freq: Once | ORAL | Status: AC
  Administered 2020-01-21: 1 via ORAL
  Filled 2020-01-21: qty 1

## 2020-01-21 NOTE — ED Provider Notes (Signed)
[facility] EMERGENCY DEPARTMENT Provider Note   CSN: [id number] Arrival date & time: 01/21/20  0501     History Chief Complaint  Patient presents with  . Chest Pain    Martha [name] is a 54 y.o. female.  HPI  54 yo female ho gerd, pud, thyroid nodule presents today with chest tightness present on awakening at 0300 this am.  She took her bp with sbp 170- no ho hypertension. Patient has had some sinus symptoms for three weeks.Marland Kitchen She was seen and tested for covid at [place]- she started decongestants and had a reaction so stopped.  Seen at urgent care and started on augmentin for sinusitis.  She has completed that course of abx.  Today she complains of headache.  The chest tightness and sob has been present for 8.5 hours.  It was worse at one point but patient cannot cite any exacerbating or improving interventions.   She has received her first covid vaccine and is in interval to second. No known exposures     Past Medical History:  Diagnosis Date  . Arthritis   . GERD (gastroesophageal reflux disease)   . PTSD (post-traumatic stress disorder)   . Tubular adenoma of colon   . Ulcer 05/05/2010   Peptic Ulcer/ H. Pylori +  . Vitamin D deficiency     Patient Active Problem List   Diagnosis Date Noted  . Anemia, iron deficiency 03/02/2014  . Heme positive stool 03/02/2014  . CTS (carpal tunnel syndrome) 06/27/2012  . Unspecified vitamin D deficiency 03/19/2012  . Multiple joint pain 03/19/2012    Past Surgical History:  Procedure Laterality Date  . COLONOSCOPY    . DEEP NECK LYMPH NODE BIOPSY / EXCISION    . ESOPHAGOGASTRODUODENOSCOPY  05/05/2010   +ulcer; +h. Pylori  . FOOT SURGERY    . TONSILLECTOMY       OB History   No obstetric history on file.     Family History  Problem Relation Age of Onset  . Anemia Mother   . Hyperlipidemia Mother   . Hypertension Father   . Hyperlipidemia Father   . Asthma Sister   . Hypertension Sister   . Hyperlipidemia  Sister   . Multiple sclerosis Maternal Grandfather   . Gout Paternal Grandfather   . Liver disease Paternal Grandfather   . Colon cancer Neg Hx     Social History   Tobacco Use  . Smoking status: Former Smoker    Quit date: 12/07/1989    Years since quitting: 30.1  . Smokeless tobacco: Never Used  Vaping Use  . Vaping Use: Never used  Substance Use Topics  . Alcohol use: Yes    Alcohol/week: 1.0 standard drink    Types: 1 Standard drinks or equivalent per week    Comment: 1-2 / week  . Drug use: No    Home Medications Prior to Admission medications   Medication Sig Start Date End Date Taking? Authorizing Provider  amoxicillin-clavulanate (AUGMENTIN) 875-125 MG tablet Take 1 tablet by mouth every 12 (twelve) hours. 01/05/20   Hall-Potvin, [place], PA-C  diclofenac (VOLTAREN) 75 MG EC tablet Take 1 tablet (75 mg total) by mouth 2 (two) times daily. 12/01/18   Wallene Huh, DPM  diclofenac Sodium (VOLTAREN) 1 % GEL Apply 2 g topically 4 (four) times daily. 01/05/20   Hall-Potvin, [place], PA-C  Ferrous Sulfate (IRON) 325 (65 Fe) MG TABS Take 325 mg by mouth 3 (three) times a week.  [provider]  Multiple Vitamin (MULTIVITAMIN WITH MINERALS) TABS tablet Take 1 tablet by mouth daily. Reported on 08/22/2015    [provider]  naproxen (NAPROSYN) 500 MG tablet Take 1 tablet (500 mg total) by mouth 2 (two) times daily. 01/05/20   Hall-Potvin, [place], PA-C    Allergies    Patient has no known allergies.  Review of Systems   Review of Systems  All other systems reviewed and are negative.   Physical Exam Updated Vital Signs BP (!) 139/92   Pulse 62   Temp 98.3 F (36.8 C) (Oral)   Resp 16   Ht 1.651 m (5\' 5" )   Wt 86.6 kg   SpO2 100%   BMI 31.78 kg/m   Physical Exam Vitals and nursing note reviewed.  Constitutional:      Appearance: She is well-developed.  HENT:     Head: Normocephalic and atraumatic.  Eyes:     Pupils: Pupils are equal,  round, and reactive to light.  Cardiovascular:     Rate and Rhythm: Normal rate and regular rhythm.     Heart sounds: Normal heart sounds.  Pulmonary:     Effort: Pulmonary effort is normal.     Breath sounds: Normal breath sounds.  Abdominal:     General: Bowel sounds are normal.     Palpations: Abdomen is soft.  Musculoskeletal:        General: Normal range of motion.     Cervical back: Normal range of motion and neck supple.  Skin:    General: Skin is warm and dry.     Capillary Refill: Capillary refill takes less than 2 seconds.  Neurological:     General: No focal deficit present.     Mental Status: She is alert.  Psychiatric:        Mood and Affect: Mood normal.        Behavior: Behavior normal.     ED Results / Procedures / Treatments   Labs (all labs ordered are listed, but only abnormal results are displayed) Labs Reviewed  BASIC METABOLIC PANEL - Abnormal; Notable for the following components:      Result Value   Glucose, Bld 111 (*)    BUN 5 (*)    All other components within normal limits  CBC - Abnormal; Notable for the following components:   MCH 25.7 (*)    All other components within normal limits  I-STAT BETA HCG BLOOD, ED ([facility], [facility], [facility] ONLY)  TROPONIN I (HIGH SENSITIVITY)  TROPONIN I (HIGH SENSITIVITY)    EKG EKG Interpretation  Date/Time:  Saturday January 21 2020 05:16:24 EDT Ventricular Rate:  76 PR Interval:  174 QRS Duration: 74 QT Interval:  404 QTC Calculation: 454 R Axis:   60 Text Interpretation: Normal sinus rhythm Cannot rule out Anterior infarct , age undetermined Abnormal ECG Confirmed by Pattricia Boss [phone]) on 01/21/2020 11:36:51 AM   Radiology DG Chest 2 View  Result Date: 01/21/2020 CLINICAL DATA:  Chest pain.  Shortness of breath and dizziness. EXAM: CHEST - 2 VIEW COMPARISON:  05/16/2008 FINDINGS: The heart size and mediastinal contours are within normal limits. Both lungs are clear. The visualized skeletal structures are  unremarkable. IMPRESSION: No active cardiopulmonary disease. Electronically Signed   By: Kerby Moors M.D.   On: 01/21/2020 06:38   CT Head Wo Contrast  Result Date: 01/21/2020 CLINICAL DATA:  Headache. EXAM: CT HEAD WITHOUT CONTRAST TECHNIQUE: Contiguous axial images were obtained from the base of the  skull through the vertex without intravenous contrast. COMPARISON:  None. FINDINGS: Brain: No evidence of acute infarction, hemorrhage, hydrocephalus, extra-axial collection or mass lesion/mass effect. Vascular: No hyperdense vessel or unexpected calcification. Skull: Normal. Negative for fracture or focal lesion. Sinuses/Orbits: No acute finding. Other: None. IMPRESSION: Normal study.  No cause for headache identified. Electronically Signed   By: Dorise Bullion III M.D   On: 01/21/2020 12:28    Procedures Procedures (including critical care time)  Medications Ordered in ED Medications  oxyCODONE-acetaminophen (PERCOCET/ROXICET) 5-325 MG per tablet 1 tablet (1 tablet Oral Given 01/21/20 1148)    ED Course  I have reviewed the triage vital signs and the nursing notes.  Pertinent labs & imaging results that were available during my care of the patient were reviewed by me and considered in my medical decision making (see chart for details).    MDM Rules/Calculators/A&P                          1- chest pain- acute ischemic changes, trop and delta trop normal.  Heart score 3 with low index of suspicion for acute coronary syndrome.  PE also low in ddx due to normal hr, no risk factors, no dvt signs, normal sats. 2- headache- ? Secondary to infectious sxs with recent uri, no s/s meningeal signs or bleeding.  Will obtain head ct ro assess no local infection with recent sinusitis  Patient given percocet for pain BP now 139/92- likely situational but will need recheck as op   Final Clinical Impression(s) / ED Diagnoses Final diagnoses:  Chest pain, unspecified type  Acute nonintractable  headache, unspecified headache type    Rx / DC Orders ED Discharge Orders    None       Pattricia Boss, MD 01/21/20 1237

## 2020-01-21 NOTE — ED Notes (Signed)
Pt back to triage for additional labs.

## 2020-01-21 NOTE — ED Triage Notes (Addendum)
Patient c/o sob and chest tightness onset earlier today, states she is also having mutli bodyaches . Last Wed just finished antibiotics for sinus infection

## 2020-01-21 NOTE — Discharge Instructions (Addendum)
Your chest pain does not appear to be a heart attack- ekg and heart enzymes do not show myocardial ischemia at this time. Head ct reveals no bleeding or infection. Please complete your covid vaccine series. If any symptoms are worsening, please return for recheck.  A normal exam at one time does not preclude heart problems or other medical emergencies and reevaluation and follow up are advised.

## 2020-06-28 NOTE — Progress Notes (Signed)
Office Visit Note  Patient: Martha [name]             Date of Birth: 1965/07/29           MRN: [id number]             PCP: [facility] Referring: Karenann Cai, PA-C Visit Date: 06/29/2020 Occupation: @GUAROCC @  Subjective:  Pain in multiple joints and muscles.   History of Present Illness: Martha [name] is a 55 y.o. female seen in consultation per request of her spine and scoliosis center.  Patient is a Marketing executive.  She states 3 years after the L4 she started having generalized pain all over her body.  She states she also started experiencing numbness and tingling in her hands and feet.  She had neck and lower back pain since she was in her 73s.  Over time she was diagnosed with degenerative disease of the cervical and lumbar spine.  The numbness in her lower extremity was evaluated by Dr. Leta Baptist did not find any neuropathy and felt that the symptoms were related to her back.  She has been going to [facility] since 2017.  She states she has been followed there for neck and lower back pain.  She also goes to [facility] center and gets injections to her neck.  She has been seeing a podiatrist since 1996.  She had bilateral bunionectomy in 2001.  She had seen a podiatrist recently who diagnosed with plantar fasciitis and placed her on prednisone at the [facility].  She has been taking prednisone for the last 3 days.  She complains of discomfort in her bilateral shoulders and bilateral knee joints.  She states she had x-rays at the [facility] and she can bring the report next visit.  She has discomfort in her hands and feet.  She states all of her joints are painful and she has a lot of muscle pain.  She is in constant discomfort and does not sleep well.  She also experiences a lot of fatigue.  She is concerned that she may have fibromyalgia.  There is no family history of autoimmune disease.  She is gravida 2, para 2, miscarriages 0.  Activities of Daily Living:  Patient  reports morning stiffness for all day.  Patient Reports nocturnal pain.  Difficulty dressing/grooming: Reports Difficulty climbing stairs: Reports Difficulty getting out of chair: Reports Difficulty using hands for taps, buttons, cutlery, and/or writing: Reports  Review of Systems  Constitutional: Positive for fatigue. Negative for night sweats, weight gain and weight loss.  HENT: Negative for mouth sores, trouble swallowing, trouble swallowing, mouth dryness and nose dryness.   Eyes: Positive for itching. Negative for pain, redness, visual disturbance and dryness.  Respiratory: Negative for cough, shortness of breath and difficulty breathing.   Cardiovascular: Negative for chest pain, palpitations, hypertension, irregular heartbeat and swelling in legs/feet.  Gastrointestinal: Positive for constipation. Negative for blood in stool and diarrhea.  Endocrine: Positive for increased urination.  Genitourinary: Negative for difficulty urinating, painful urination and vaginal dryness.  Musculoskeletal: Positive for arthralgias, joint pain, myalgias, morning stiffness, muscle tenderness and myalgias. Negative for joint swelling and muscle weakness.  Skin: Negative for color change, rash, hair loss, redness, skin tightness, ulcers and sensitivity to sunlight.  Allergic/Immunologic: Positive for susceptible to infections.  Neurological: Positive for numbness, headaches and weakness. Negative for dizziness, memory loss and night sweats.  Hematological: Negative for bruising/bleeding tendency and swollen glands.  Psychiatric/Behavioral: Positive for depressed mood and  sleep disturbance. Negative for confusion. The patient is nervous/anxious.     PMFS History:  Patient Active Problem List   Diagnosis Date Noted  . Anemia, iron deficiency 03/02/2014  . Heme positive stool 03/02/2014  . CTS (carpal tunnel syndrome) 06/27/2012  . Unspecified vitamin D deficiency 03/19/2012  . Multiple joint pain  03/19/2012    Past Medical History:  Diagnosis Date  . Arthritis   . GERD (gastroesophageal reflux disease)   . PTSD (post-traumatic stress disorder)   . Tubular adenoma of colon   . Ulcer 05/05/2010   Peptic Ulcer/ H. Pylori +  . Vitamin D deficiency     Family History  Problem Relation Age of Onset  . Anemia Mother   . Hyperlipidemia Mother   . Hypertension Father   . Hyperlipidemia Father   . Asthma Sister   . Hypertension Sister   . Hyperlipidemia Sister   . Healthy Sister   . Healthy Sister   . Multiple sclerosis Maternal Grandfather   . Gout Paternal Grandfather   . Liver disease Paternal Grandfather   . Healthy Son   . Healthy Daughter   . Colon cancer Neg Hx    Past Surgical History:  Procedure Laterality Date  . COLONOSCOPY    . DEEP NECK LYMPH NODE BIOPSY / EXCISION    . ESOPHAGOGASTRODUODENOSCOPY  05/05/2010   +ulcer; +h. Pylori  . FOOT SURGERY    . TONSILLECTOMY     Social History   Social History Narrative   Marital status: married.   Children: 2 children (12, 8)   Employment: Publishing rights manager.      Immunization History  Administered Date(s) Administered  . PFIZER(Purple Top)SARS-COV-2 Vaccination 01/02/2020, 01/23/2020  . PPD Test 08/22/2015  . Td 08/22/2015  . Tdap 03/27/2006     Objective: Vital Signs: BP 132/87 (BP Location: Right Arm, Patient Position: Sitting, Cuff Size: Normal)   Pulse 82   Resp 14   Ht 5' 3.5" (1.613 m)   Wt 193 lb (87.5 kg)   BMI 33.65 kg/m    Physical Exam Vitals and nursing note reviewed.  Constitutional:      Appearance: She is well-developed and well-nourished.  HENT:     Head: Normocephalic and atraumatic.  Eyes:     Extraocular Movements: EOM normal.     Conjunctiva/sclera: Conjunctivae normal.  Cardiovascular:     Rate and Rhythm: Normal rate and regular rhythm.     Pulses: Intact distal pulses.     Heart sounds: Normal heart sounds.  Pulmonary:     Effort: Pulmonary effort is normal.      Breath sounds: Normal breath sounds.  Abdominal:     General: Bowel sounds are normal.     Palpations: Abdomen is soft.  Musculoskeletal:     Cervical back: Normal range of motion.  Lymphadenopathy:     Cervical: No cervical adenopathy.  Skin:    General: Skin is warm and dry.     Capillary Refill: Capillary refill takes less than 2 seconds.  Neurological:     Mental Status: She is alert and oriented to person, place, and time.  Psychiatric:        Mood and Affect: Mood and affect normal.        Behavior: Behavior normal.      Musculoskeletal Exam: She has limited range of motion of cervical and lumbar spine.  She has discomfort with range of motion.  Shoulder joints, elbow joints, wrist joints, MCPs PIPs and DIPs with  good range of motion with no synovitis.  Hip joints, knee joints, ankles, MTPs and PIPs with good range of motion with no synovitis.  She had pain and discomfort with range of motion of most of her joints.  She also had hyperalgesia and positive tender points.  CDAI Exam: CDAI Score: -- Patient Global: --; Provider Global: -- Swollen: --; Tender: -- Joint Exam 06/29/2020   No joint exam has been documented for this visit   There is currently no information documented on the homunculus. Go to the Rheumatology activity and complete the homunculus joint exam.  Investigation: No additional findings.  Imaging: No results found.  Recent Labs: Lab Results  Component Value Date   WBC 6.7 01/21/2020   HGB 12.4 01/21/2020   PLT 311 01/21/2020   NA 139 01/21/2020   K 4.1 01/21/2020   CL 104 01/21/2020   CO2 28 01/21/2020   GLUCOSE 111 (H) 01/21/2020   BUN 5 (L) 01/21/2020   CREATININE 0.71 01/21/2020   BILITOT 0.4 05/07/2015   ALKPHOS 78 05/07/2015   AST 12 05/07/2015   ALT 7 05/07/2015   PROT 7.0 05/07/2015   ALBUMIN 3.8 05/07/2015   CALCIUM 9.6 01/21/2020   GFRAA >60 01/21/2020    Speciality Comments: No specialty comments available.  Procedures:   No procedures performed Allergies: Patient has no known allergies.   Assessment / Plan:     Visit Diagnoses: Polyarthralgia-she complains of pain and discomfort in almost all of her joints which include cervical spine, lumbar spine, shoulders, wrist, hands, hips, knees, ankles and feet.  She had x-rays of her shoulders and knee joints at the [facility] and she will bring the report.  I do not see any synovitis on my examination today.  She has been on prednisone for the last 3 days by the podiatrist at the [facility].  Pain in both hands -she complains of pain and discomfort in the bilateral hands.  No synovitis was noted.  I will obtain x-rays and labs which were discussed with the patient and she was in agreement to proceed with the work-up.  Plan: XR Hand 2 View Right, XR Hand 2 View Left, x-rays of bilateral hands were consistent with osteoarthritis.  Sedimentation rate, Rheumatoid factor, ANA, Cyclic citrul peptide antibody, IgG, Uric acid  Primary osteoarthritis of both feet-she has chronic discomfort in her feet.  She has been under care of Dr. Felisa Bonier and at the [facility].  I also reviewed the x-rays from the past which showed degenerative changes.  She had bilateral bunionectomy in the past.  Plantar fasciitis, bilateral-she was diagnosed with plantar fasciitis recently at the [facility]  She was placed on prednisone.  She has been on prednisone for the last 3 days.  She had no synovitis on my examination.  DDD (degenerative disc disease), cervical - She is seen at the spine and scoliosis center.  She has had cortisone injections to her cervical spine per patient.  DDD (degenerative disc disease), lumbar - X-rays from May 22, 2017 showed mild disc disease and facet joint arthropathy.  She is followed at the spinal scoliosis center and also at the [facility].  Myalgia -she complains of pain and discomfort in all of her muscles.- Plan: CK  Insomnia due to medical condition-due to  nocturnal pain.  Other fatigue -she experiences fatigue due to insomnia.  Plan: TSH, Serum protein electrophoresis with reflex  Bilateral carpal tunnel syndrome-patient reports that she had right carpal tunnel injections several  years ago.  She has intermittent symptoms.  History of iron deficiency anemia - Treated by hematologist in the past.  She takes iron on a regular basis her hemoglobin is normal now.  Anxiety and depression - on Zoloft  Orders: Orders Placed This Encounter  Procedures  . XR Hand 2 View Right  . XR Hand 2 View Left  . CK  . TSH  . Sedimentation rate  . Rheumatoid factor  . ANA  . Cyclic citrul peptide antibody, IgG  . Uric acid  . Serum protein electrophoresis with reflex   No orders of the defined types were placed in this encounter.    Follow-Up Instructions: Return for arthralgia, myalgia.   Bo Merino, MD  Note - This record has been created using Editor, commissioning.  Chart creation errors have been sought, but may not always  have been located. Such creation errors do not reflect on  the standard of medical care.

## 2020-06-29 ENCOUNTER — Other Ambulatory Visit: Payer: Self-pay

## 2020-06-29 ENCOUNTER — Encounter: Payer: Self-pay | Admitting: Rheumatology

## 2020-06-29 ENCOUNTER — Ambulatory Visit: Payer: Self-pay

## 2020-06-29 ENCOUNTER — Ambulatory Visit: Payer: 59 | Admitting: Rheumatology

## 2020-06-29 VITALS — BP 132/87 | HR 82 | Resp 14 | Ht 63.5 in | Wt 193.0 lb

## 2020-06-29 DIAGNOSIS — M79642 Pain in left hand: Secondary | ICD-10-CM

## 2020-06-29 DIAGNOSIS — M2550 Pain in unspecified joint: Secondary | ICD-10-CM | POA: Diagnosis not present

## 2020-06-29 DIAGNOSIS — M5416 Radiculopathy, lumbar region: Secondary | ICD-10-CM

## 2020-06-29 DIAGNOSIS — G5603 Carpal tunnel syndrome, bilateral upper limbs: Secondary | ICD-10-CM

## 2020-06-29 DIAGNOSIS — M47812 Spondylosis without myelopathy or radiculopathy, cervical region: Secondary | ICD-10-CM

## 2020-06-29 DIAGNOSIS — F32A Depression, unspecified: Secondary | ICD-10-CM

## 2020-06-29 DIAGNOSIS — M79641 Pain in right hand: Secondary | ICD-10-CM

## 2020-06-29 DIAGNOSIS — M722 Plantar fascial fibromatosis: Secondary | ICD-10-CM

## 2020-06-29 DIAGNOSIS — G4701 Insomnia due to medical condition: Secondary | ICD-10-CM

## 2020-06-29 DIAGNOSIS — F419 Anxiety disorder, unspecified: Secondary | ICD-10-CM

## 2020-06-29 DIAGNOSIS — M19071 Primary osteoarthritis, right ankle and foot: Secondary | ICD-10-CM

## 2020-06-29 DIAGNOSIS — M51369 Other intervertebral disc degeneration, lumbar region without mention of lumbar back pain or lower extremity pain: Secondary | ICD-10-CM

## 2020-06-29 DIAGNOSIS — M5030 Other cervical disc degeneration, unspecified cervical region: Secondary | ICD-10-CM

## 2020-06-29 DIAGNOSIS — M19072 Primary osteoarthritis, left ankle and foot: Secondary | ICD-10-CM

## 2020-06-29 DIAGNOSIS — R5383 Other fatigue: Secondary | ICD-10-CM

## 2020-06-29 DIAGNOSIS — M5136 Other intervertebral disc degeneration, lumbar region: Secondary | ICD-10-CM

## 2020-06-29 DIAGNOSIS — M791 Myalgia, unspecified site: Secondary | ICD-10-CM

## 2020-06-29 DIAGNOSIS — Z862 Personal history of diseases of the blood and blood-forming organs and certain disorders involving the immune mechanism: Secondary | ICD-10-CM

## 2020-06-29 DIAGNOSIS — G5600 Carpal tunnel syndrome, unspecified upper limb: Secondary | ICD-10-CM

## 2020-07-03 LAB — PROTEIN ELECTROPHORESIS, SERUM, WITH REFLEX
Albumin ELP: 4.5 g/dL (ref 3.8–4.8)
Alpha 1: 0.3 g/dL (ref 0.2–0.3)
Alpha 2: 0.7 g/dL (ref 0.5–0.9)
Beta 2: 0.6 g/dL — ABNORMAL HIGH (ref 0.2–0.5)
Beta Globulin: 0.5 g/dL (ref 0.4–0.6)
Gamma Globulin: 1.7 g/dL (ref 0.8–1.7)
Total Protein: 8.3 g/dL — ABNORMAL HIGH (ref 6.1–8.1)

## 2020-07-03 LAB — CYCLIC CITRUL PEPTIDE ANTIBODY, IGG: Cyclic Citrullin Peptide Ab: 19 UNITS

## 2020-07-03 LAB — SEDIMENTATION RATE: Sed Rate: 9 mm/h (ref 0–30)

## 2020-07-03 LAB — URIC ACID: Uric Acid, Serum: 3.3 mg/dL (ref 2.5–7.0)

## 2020-07-03 LAB — RHEUMATOID FACTOR: Rheumatoid fact SerPl-aCnc: <14 IU/mL (ref <14)

## 2020-07-03 LAB — ANTI-NUCLEAR AB-TITER (ANA TITER): ANA Titer 1: 1:40 {titer} — ABNORMAL HIGH

## 2020-07-03 LAB — TSH: TSH: 0.82 mIU/L

## 2020-07-03 LAB — CK: Total CK: 50 U/L (ref 29–143)

## 2020-07-03 LAB — IFE INTERPRETATION: Immunofix Electr Int: NOT DETECTED

## 2020-07-03 LAB — ANA: Anti Nuclear Antibody (ANA): POSITIVE — AB

## 2020-07-03 NOTE — Progress Notes (Signed)
We will discuss results at the follow-up visit.

## 2020-07-15 NOTE — Progress Notes (Signed)
Office Visit Note  Patient: Martha [name]             Date of Birth: 12/29/1965           MRN: [id number]             PCP: [place] Visit Date: 07/27/2020 Occupation: @GUAROCC @  Subjective:  Pain in all the joints and muscles.   History of Present Illness: Martha [name] is a 55 y.o. female returns today for follow-up visit.  She states she continues to have pain and discomfort in all of her joints and muscles.  She continues to have insomnia and fatigue.  She has pain everywhere.  She touches.  She has not noticed any joint swelling.  She complains of discomfort in her entire spine, shoulders, wrist, hands, hips knees, ankles and her feet.  Activities of Daily Living:  Patient reports morning stiffness for several hours hours.   Patient Reports nocturnal pain.  Difficulty dressing/grooming: Reports Difficulty climbing stairs: Reports Difficulty getting out of chair: Reports Difficulty using hands for taps, buttons, cutlery, and/or writing: Reports  Review of Systems  Constitutional: Positive for fatigue. Negative for night sweats, weight gain and weight loss.  HENT: Positive for mouth dryness. Negative for mouth sores, trouble swallowing, trouble swallowing and nose dryness.   Eyes: Negative for pain, redness, visual disturbance and dryness.  Respiratory: Negative for cough, shortness of breath and difficulty breathing.   Cardiovascular: Negative for chest pain, palpitations, hypertension, irregular heartbeat and swelling in legs/feet.  Gastrointestinal: Negative for blood in stool, constipation and diarrhea.  Endocrine: Negative for increased urination.  Genitourinary: Negative for vaginal dryness.  Musculoskeletal: Positive for arthralgias, joint pain, myalgias, morning stiffness, muscle tenderness and myalgias. Negative for joint swelling and muscle weakness.  Skin: Negative for color change, rash, hair loss, skin tightness, ulcers and  sensitivity to sunlight.  Allergic/Immunologic: Negative for susceptible to infections.  Neurological: Negative for dizziness, memory loss, night sweats and weakness.  Hematological: Negative for swollen glands.  Psychiatric/Behavioral: Positive for sleep disturbance. Negative for depressed mood. The patient is not nervous/anxious.     PMFS History:  Patient Active Problem List   Diagnosis Date Noted  . Anemia, iron deficiency 03/02/2014  . Heme positive stool 03/02/2014  . CTS (carpal tunnel syndrome) 06/27/2012  . Unspecified vitamin D deficiency 03/19/2012  . Multiple joint pain 03/19/2012    Past Medical History:  Diagnosis Date  . Arthritis   . GERD (gastroesophageal reflux disease)   . PTSD (post-traumatic stress disorder)   . Tubular adenoma of colon   . Ulcer 05/05/2010   Peptic Ulcer/ H. Pylori +  . Vitamin D deficiency     Family History  Problem Relation Age of Onset  . Anemia Mother   . Hyperlipidemia Mother   . Hypertension Father   . Hyperlipidemia Father   . Asthma Sister   . Hypertension Sister   . Hyperlipidemia Sister   . Healthy Sister   . Healthy Sister   . Multiple sclerosis Maternal Grandfather   . Gout Paternal Grandfather   . Liver disease Paternal Grandfather   . Healthy Son   . Healthy Daughter   . Colon cancer Neg Hx    Past Surgical History:  Procedure Laterality Date  . COLONOSCOPY    . DEEP NECK LYMPH NODE BIOPSY / EXCISION    . ESOPHAGOGASTRODUODENOSCOPY  05/05/2010   +ulcer; +h. Pylori  . FOOT SURGERY    . TONSILLECTOMY  Social History   Social History Narrative   Marital status: married.   Children: 2 children (12, 8)   Employment: Publishing rights manager.      Immunization History  Administered Date(s) Administered  . PFIZER(Purple Top)SARS-COV-2 Vaccination 07/14/2019, 01/02/2020, 01/23/2020  . PPD Test 08/22/2015  . Td 08/22/2015  . Tdap 03/27/2006     Objective: Vital Signs: BP 121/82 (BP Location: Left Arm,  Patient Position: Sitting, Cuff Size: Normal)   Pulse 79   Ht 5\' 4"  (1.626 m)   Wt 194 lb 6.4 oz (88.2 kg)   BMI 33.37 kg/m    Physical Exam Vitals and nursing note reviewed.  Constitutional:      Appearance: She is well-developed.  HENT:     Head: Normocephalic and atraumatic.  Eyes:     Conjunctiva/sclera: Conjunctivae normal.  Cardiovascular:     Rate and Rhythm: Normal rate and regular rhythm.     Heart sounds: Normal heart sounds.  Pulmonary:     Effort: Pulmonary effort is normal.     Breath sounds: Normal breath sounds.  Abdominal:     General: Bowel sounds are normal.     Palpations: Abdomen is soft.  Musculoskeletal:     Cervical back: Normal range of motion.  Lymphadenopathy:     Cervical: No cervical adenopathy.  Skin:    General: Skin is warm and dry.     Capillary Refill: Capillary refill takes less than 2 seconds.  Neurological:     Mental Status: She is alert and oriented to person, place, and time.  Psychiatric:        Behavior: Behavior normal.      Musculoskeletal Exam: She had limited painful range of motion of her cervical spine.  She had discomfort range of motion of lumbar spine.  Shoulder joints, elbow joints, wrist joints, MCPs PIPs and DIPs with good range of motion with no synovitis.  Hip joints, knee joints, ankles, MTPs and PIPs with good range of motion with no synovitis.    CDAI Exam: CDAI Score: - Patient Global: -; Provider Global: - Swollen: -; Tender: - Joint Exam 07/27/2020   No joint exam has been documented for this visit   There is currently no information documented on the homunculus. Go to the Rheumatology activity and complete the homunculus joint exam.  Investigation: No additional findings.  Imaging: XR Hand 2 View Left  Result Date: 06/29/2020 CMC, PIP and DIP narrowing was noted.  No MCP, intercarpal or radiocarpal joint space narrowing was noted.  No erosive changes were noted. Impression: These findings are  consistent with osteoarthritis of the hand.  XR Hand 2 View Right  Result Date: 06/29/2020 CMC, PIP and DIP narrowing was noted.  No MCP, intercarpal or radiocarpal joint space narrowing was noted.  No erosive changes were noted. Impression: These findings are consistent with osteoarthritis of the hand.   Recent Labs: Lab Results  Component Value Date   WBC 6.7 01/21/2020   HGB 12.4 01/21/2020   PLT 311 01/21/2020   NA 139 01/21/2020   K 4.1 01/21/2020   CL 104 01/21/2020   CO2 28 01/21/2020   GLUCOSE 111 (H) 01/21/2020   BUN 5 (L) 01/21/2020   CREATININE 0.71 01/21/2020   BILITOT 0.4 05/07/2015   ALKPHOS 78 05/07/2015   AST 12 05/07/2015   ALT 7 05/07/2015   PROT 8.3 (H) 06/29/2020   ALBUMIN 3.8 05/07/2015   CALCIUM 9.6 01/21/2020   GFRAA >60 01/21/2020   June 29, 2020  IFE negative, ANA 1: 40NH, RF negative, anti-CCP negative, uric acid 3.3, CK 50, TSH normal, sed rate 9  Speciality Comments: No specialty comments available.  Procedures:  No procedures performed Allergies: Patient has no known allergies.   Assessment / Plan:     Visit Diagnoses: Primary osteoarthritis of both hands - Clinical and radiographic findings are consistent with osteoarthritis.  All autoimmune work-up is negative.  X-ray findings and lab findings are discussed at length.  Detailed counseling osteoarthritis was provided.  Joint protection muscle strengthening was discussed.  Bilateral carpal tunnel syndrome - Jacqulynn Cadet has intermittent symptoms of carpal tunnel syndrome.  She has had right carpal tunnel cortisone injections in the past.  Primary osteoarthritis of both feet - She is followed by Dr. Paulla Dolly and at the [facility].  S/p bilateral bunionectomy.  Plantar fasciitis, bilateral - Treated at the [facility] in February with prednisone for plantar fasciitis per patient.  DDD (degenerative disc disease), cervical - [facility].  She has had cortisone injections in the  past.  DDD (degenerative disc disease), lumbar - Mild degenerative disc disease and facet joint arthropathy.  She goes to spinal scoliosis center and also at the [facility].  Myofascial pain - CK and TSH were normal.  She has generalized myalgias and positive tender points.  Her clinical findings are consistent with fibromyalgia.  Detail concerning fibromyalgia syndrome was provided.  I offered physical therapy which she declined.  She will go to physical therapy through [facility].  Need for regular exercise and stretching was emphasized.  Good sleep hygiene was discussed.  Insomnia due to medical condition - Use nocturnal pain per patient.  Other fatigue - Due to insomnia.  Good sleep hygiene was discussed.  History of iron deficiency anemia  Anxiety and depression - She is on Zoloft.  Orders: No orders of the defined types were placed in this encounter.  No orders of the defined types were placed in this encounter. .  Follow-Up Instructions: Return if symptoms worsen or fail to improve, for Osteoarthritis.   Bo Merino, MD  Note - This record has been created using Editor, commissioning.  Chart creation errors have been sought, but may not always  have been located. Such creation errors do not reflect on  the standard of medical care.

## 2020-07-27 ENCOUNTER — Other Ambulatory Visit: Payer: Self-pay

## 2020-07-27 ENCOUNTER — Encounter: Payer: Self-pay | Admitting: Rheumatology

## 2020-07-27 ENCOUNTER — Ambulatory Visit: Payer: 59 | Admitting: Rheumatology

## 2020-07-27 VITALS — BP 121/82 | HR 79 | Ht 64.0 in | Wt 194.4 lb

## 2020-07-27 DIAGNOSIS — F419 Anxiety disorder, unspecified: Secondary | ICD-10-CM

## 2020-07-27 DIAGNOSIS — R5383 Other fatigue: Secondary | ICD-10-CM

## 2020-07-27 DIAGNOSIS — M722 Plantar fascial fibromatosis: Secondary | ICD-10-CM

## 2020-07-27 DIAGNOSIS — F32A Depression, unspecified: Secondary | ICD-10-CM

## 2020-07-27 DIAGNOSIS — G4701 Insomnia due to medical condition: Secondary | ICD-10-CM

## 2020-07-27 DIAGNOSIS — M19071 Primary osteoarthritis, right ankle and foot: Secondary | ICD-10-CM

## 2020-07-27 DIAGNOSIS — M51369 Other intervertebral disc degeneration, lumbar region without mention of lumbar back pain or lower extremity pain: Secondary | ICD-10-CM

## 2020-07-27 DIAGNOSIS — M5030 Other cervical disc degeneration, unspecified cervical region: Secondary | ICD-10-CM

## 2020-07-27 DIAGNOSIS — G5603 Carpal tunnel syndrome, bilateral upper limbs: Secondary | ICD-10-CM | POA: Diagnosis not present

## 2020-07-27 DIAGNOSIS — M5136 Other intervertebral disc degeneration, lumbar region: Secondary | ICD-10-CM

## 2020-07-27 DIAGNOSIS — M19042 Primary osteoarthritis, left hand: Secondary | ICD-10-CM

## 2020-07-27 DIAGNOSIS — M7918 Myalgia, other site: Secondary | ICD-10-CM

## 2020-07-27 DIAGNOSIS — M19041 Primary osteoarthritis, right hand: Secondary | ICD-10-CM | POA: Diagnosis not present

## 2020-07-27 DIAGNOSIS — Z862 Personal history of diseases of the blood and blood-forming organs and certain disorders involving the immune mechanism: Secondary | ICD-10-CM

## 2020-07-27 DIAGNOSIS — M19072 Primary osteoarthritis, left ankle and foot: Secondary | ICD-10-CM

## 2020-07-27 NOTE — Progress Notes (Deleted)
Office Visit Note  Patient: Martha [name]             Date of Birth: 07/15/1965           MRN: [id number]             PCP: [place] Visit Date: 07/27/2020 Occupation: @GUAROCC @  Subjective:  No chief complaint on file.   History of Present Illness: Martha [name] is a 55 y.o. female ***   Activities of Daily Living:  Patient reports morning stiffness for several hours.   Patient Reports nocturnal pain.  Difficulty dressing/grooming: Reports Difficulty climbing stairs: Reports Difficulty getting out of chair: Reports Difficulty using hands for taps, buttons, cutlery, and/or writing: Reports  Review of Systems  Constitutional: Positive for fatigue.  HENT: Positive for mouth sores and mouth dryness. Negative for nose dryness.   Eyes: Positive for itching. Negative for pain, visual disturbance and dryness.  Respiratory: Negative for cough, hemoptysis, shortness of breath and difficulty breathing.   Cardiovascular: Positive for chest pain and palpitations. Negative for swelling in legs/feet.  Gastrointestinal: Positive for abdominal pain, constipation and diarrhea. Negative for blood in stool.  Endocrine: Positive for increased urination.  Genitourinary: Negative for painful urination.  Musculoskeletal: Positive for arthralgias, joint pain, joint swelling, myalgias, muscle weakness, morning stiffness, muscle tenderness and myalgias.  Skin: Negative for color change, rash and redness.  Allergic/Immunologic: Negative for susceptible to infections.  Neurological: Positive for dizziness, numbness, headaches and weakness. Negative for memory loss.  Hematological: Negative for swollen glands.  Psychiatric/Behavioral: Positive for sleep disturbance. Negative for confusion.    PMFS History:  Patient Active Problem List   Diagnosis Date Noted  . Anemia, iron deficiency 03/02/2014  . Heme positive stool 03/02/2014  . CTS (carpal tunnel syndrome)  06/27/2012  . Unspecified vitamin D deficiency 03/19/2012  . Multiple joint pain 03/19/2012    Past Medical History:  Diagnosis Date  . Arthritis   . GERD (gastroesophageal reflux disease)   . PTSD (post-traumatic stress disorder)   . Tubular adenoma of colon   . Ulcer 05/05/2010   Peptic Ulcer/ H. Pylori +  . Vitamin D deficiency     Family History  Problem Relation Age of Onset  . Anemia Mother   . Hyperlipidemia Mother   . Hypertension Father   . Hyperlipidemia Father   . Asthma Sister   . Hypertension Sister   . Hyperlipidemia Sister   . Healthy Sister   . Healthy Sister   . Multiple sclerosis Maternal Grandfather   . Gout Paternal Grandfather   . Liver disease Paternal Grandfather   . Healthy Son   . Healthy Daughter   . Colon cancer Neg Hx    Past Surgical History:  Procedure Laterality Date  . COLONOSCOPY    . DEEP NECK LYMPH NODE BIOPSY / EXCISION    . ESOPHAGOGASTRODUODENOSCOPY  05/05/2010   +ulcer; +h. Pylori  . FOOT SURGERY    . TONSILLECTOMY     Social History   Social History Narrative   Marital status: married.   Children: 2 children (12, 8)   Employment: Publishing rights manager.      Immunization History  Administered Date(s) Administered  . PFIZER(Purple Top)SARS-COV-2 Vaccination 01/02/2020, 01/23/2020  . PPD Test 08/22/2015  . Td 08/22/2015  . Tdap 03/27/2006     Objective: Vital Signs: Ht 5\' 4"  (1.626 m)   Wt 194 lb 6.4 oz (88.2 kg)   BMI 33.37 kg/m  Physical Exam   Musculoskeletal Exam: ***  CDAI Exam: CDAI Score: -- Patient Global: --; Provider Global: -- Swollen: --; Tender: -- Joint Exam 07/27/2020   No joint exam has been documented for this visit   There is currently no information documented on the homunculus. Go to the Rheumatology activity and complete the homunculus joint exam.  Investigation: No additional findings.  Imaging: XR Hand 2 View Left  Result Date: 06/29/2020 CMC, PIP and DIP narrowing was noted.   No MCP, intercarpal or radiocarpal joint space narrowing was noted.  No erosive changes were noted. Impression: These findings are consistent with osteoarthritis of the hand.  XR Hand 2 View Right  Result Date: 06/29/2020 CMC, PIP and DIP narrowing was noted.  No MCP, intercarpal or radiocarpal joint space narrowing was noted.  No erosive changes were noted. Impression: These findings are consistent with osteoarthritis of the hand.   Recent Labs: Lab Results  Component Value Date   WBC 6.7 01/21/2020   HGB 12.4 01/21/2020   PLT 311 01/21/2020   NA 139 01/21/2020   K 4.1 01/21/2020   CL 104 01/21/2020   CO2 28 01/21/2020   GLUCOSE 111 (H) 01/21/2020   BUN 5 (L) 01/21/2020   CREATININE 0.71 01/21/2020   BILITOT 0.4 05/07/2015   ALKPHOS 78 05/07/2015   AST 12 05/07/2015   ALT 7 05/07/2015   PROT 8.3 (H) 06/29/2020   ALBUMIN 3.8 05/07/2015   CALCIUM 9.6 01/21/2020   GFRAA >60 01/21/2020    Speciality Comments: No specialty comments available.  Procedures:  No procedures performed Allergies: Patient has no known allergies.   Assessment / Plan:     Visit Diagnoses: Primary osteoarthritis of both hands - Clinical and radiographic findings are consistent with osteoarthritis.  All autoimmune work-up is negative.  Bilateral carpal tunnel syndrome - Martha [name] has intermittent symptoms of carpal tunnel syndrome.  She has had right carpal tunnel cortisone injections in the past.  Primary osteoarthritis of both feet - She is followed by Dr. Paulla [name] and at the [facility].  S/p bilateral bunionectomy.  Plantar fasciitis, bilateral - Treated at the [facility] in February with prednisone for plantar fasciitis per patient.  DDD (degenerative disc disease), cervical - [facility]ed [facility] the [facility].  She has had cortisone injections in the past.  DDD (degenerative disc disease), lumbar - Mild degenerative disc disease and facet joint arthropathy.  She goes to spinal scoliosis center and also  at the [facility].  Myofascial pain - CK and TSH were normal.  She has generalized myalgias and positive tender points.  Insomnia due to medical condition - Use nocturnal pain per patient.  Other fatigue - Due to insomnia.  History of iron deficiency anemia  Anxiety and depression - She is on Zoloft.  Orders: No orders of the defined types were placed in this encounter.  No orders of the defined types were placed in this encounter.   Face-to-face time spent with patient was *** minutes. Greater than 50% of time was spent in counseling and coordination of care.  Follow-Up Instructions: No follow-ups on file.  Note - This record has been created using [employer].  Chart creation errors have been sought, but may not always  have been located. Such creation errors do not reflect on  the standard of medical care.

## 2020-08-10 ENCOUNTER — Ambulatory Visit: Payer: No Typology Code available for payment source | Admitting: Rheumatology

## 2020-09-18 ENCOUNTER — Ambulatory Visit: Payer: No Typology Code available for payment source | Admitting: Rheumatology

## 2020-11-14 ENCOUNTER — Telehealth: Payer: Self-pay | Admitting: *Deleted

## 2020-11-14 NOTE — Telephone Encounter (Signed)
Patient dropped off paperwork to see if Dr. Estanislado Pandy would be able to complete for her [facility] file. Dr.Deveshwar reviewed paperwork and advised she would be unable to complete the paperwork. Dr. Estanislado Pandy advised the paperwork should be completed by her PCP or she should have a functional capacity evaluation in which the person performing this should complete it. Patient advised. Patient then asked for a letter for her [place] file detailing her dx, date of treatment, and test performed. Patient advised due to HIPPA we are unable to provide that information in a letter but we can give her a copy of her records. Patient will come by to pick those up.

## 2020-12-03 ENCOUNTER — Encounter: Payer: Self-pay | Admitting: Physical Therapy

## 2020-12-03 ENCOUNTER — Ambulatory Visit: Payer: 59 | Attending: Obstetrics and Gynecology | Admitting: Physical Therapy

## 2020-12-03 ENCOUNTER — Other Ambulatory Visit: Payer: Self-pay

## 2020-12-03 DIAGNOSIS — M6281 Muscle weakness (generalized): Secondary | ICD-10-CM | POA: Diagnosis present

## 2020-12-03 DIAGNOSIS — R2689 Other abnormalities of gait and mobility: Secondary | ICD-10-CM | POA: Insufficient documentation

## 2020-12-03 DIAGNOSIS — R279 Unspecified lack of coordination: Secondary | ICD-10-CM | POA: Diagnosis present

## 2020-12-03 NOTE — Patient Instructions (Signed)
Bladder Irritants  Certain foods and beverages can be irritating to the bladder.  Avoiding these irritants may decrease your symptoms of urinary urgency, frequency or bladder pain.  Even reducing your intake can help with your symptoms.  Not everyone is sensitive to all bladder irritants, so you may consider focusing on one irritant at a time, removing or reducing your intake of that irritant for 7-10 days to see if this change helps your symptoms.  Water intake is also very important.  Below is a list of bladder irritants.  Drinks: alcohol, carbonated beverages, caffeinated beverages such as coffee and tea, drinks with artificial sweeteners, citrus juices, apple juice, tomato juice  Foods: tomatoes and tomato based foods, spicy food, sugar and artificial sweeteners, vinegar, chocolate, raw onion, apples, citrus fruits, pineapple, cranberries, tomatoes, strawberries, plums, peaches, cantaloupe  Other: acidic urine (too concentrated) - see water intake info below  Substitutes you can try that are NOT irritating to the bladder: cooked onion, pears, papayas, sun-brewed decaf teas, watermelons, non-citrus herbal teas, apricots, kava and low-acid instant drinks (Postum).    WATER INTAKE: Remember to drink lots of water (aim for fluid intake of half your body weight with 2/3 of fluids being water).  You may be limiting fluids due to fear of leakage, but this can actually worsen urgency symptoms due to highly concentrated urine.  Water helps balance the pH of your urine so it doesn't become too acidic - acidic urine is a bladder irritant!    Lubrication Used for intercourse to reduce friction Avoid ones that have glycerin, warming gels, tingling gels, icing or cooling gel, scented Avoid parabens due to a preservative similar to female sex hormone May need to be reapplied once or several times during sexual activity Can be applied to both partners genitals prior to vaginal penetration to minimize  friction or irritation Prevent irritation and mucosal tears that cause post coital pain and increased the risk of vaginal and urinary tract infections Oil-based lubricants cannot be used with condoms due to breaking them down.  Least likely to irritate vaginal tissue.  Plant based-lubes are safe Silicone-based lubrication are thicker and last long and used for post-menopausal women  Vaginal Lubricators Here is a list of some suggested lubricators you can use for intercourse. Use the most hypoallergenic product.  You can place on you or your partner.  Slippery Stuff ( water based) Sylk or Sliquid Natural H2O ( good  if frequent UTI's)- [employer], amazon Sliquid organics silk-(aloe and silicone based ) [employer] (www.blossom-organics.com)- (aloe based ) Coconut oil, olive oil -not good with condoms  PJur Woman Nude- (water based) amazon Uberlube- ( silicon) [place] has an organic one Yes lubricant- (water based and has plant oil based similar to silicone) [employer] Platinum-Silicone, [employer], [facility] Olive and Bee intimate cream-  www.oliveandbee.com.au Pink - [employer] stuff Erosense Sync- walmart, amazon Coconu- FailLinks.co.uk  Things to avoid in lubricants are glycerin, warming gels, tingling gels, icing or cooling  gels, and scented gels.  Also avoid Vaseline. KY jelly, Replens, and Astroglide contain chlorhexidine which kills good bacteria(lactobacilli)  Things to avoid in the vaginal area Do not use things to irritate the vulvar area No lotions- see below Soaps you  can use :Aveeno, [place], Good Clean Love cleanser if needed. Must be gentle No deodorants No douches Good to sleep without underwear to let the vaginal area to air out No scrubbing: spread the lips to let warm water rinse over labias and pat  dry  Creams that can be used on the Vulva Area V [employer], [employer] Vital V [place] Salve Julva- [employer] Botanical Pro-Meno Wild Yam  Cream Coconut oil, olive oil Cleo by [employer] labial moisturizer -Amazon,  Desert [place] Releveum ( lidocaine) or [place] is a strategy you may use to help to reduce or prevent leakage or passing of urine, gas or feces during an activity that causes downward force on the pelvic floor muscles.    Activities that can cause downward pressure on the pelvic floor muscles include coughing, sneezing, laughing, bending, lifting, and transitioning from different body positions such as from laying down to sitting up and sitting to standing.  To perform The Knack, consciously squeeze and lift your pelvic floor muscles to perform a strong, well-timed pelvic muscle contraction BEFORE AND DURING these activities above.  As your contraction gets more coordinated and your muscles get stronger, you will become more effective in controlling your experience of incontinence or gas passing during these activities.    [facility] [place] [address] [place], [place] [address] Phone # [phone] Fax [phone]

## 2020-12-03 NOTE — Therapy (Signed)
[facility] [address] [place], [place], [address] Phone: [phone]   Fax:  [phone]  Physical Therapy Evaluation  Patient Details  Name: Martha [name] MRN: [id number] Date of Birth: [birth year] Referring Provider (PT): Everett Graff, MD   Encounter Date: 12/03/2020   PT End of Session - 12/03/20 1502     Visit Number 1    Authorization Type UHC    Authorization - Visit Number 1    PT Start Time 0845    PT Stop Time 0930    PT Time Calculation (min) 45 min    Activity Tolerance Patient tolerated treatment well;Patient limited by pain   minimal pain noted by pt during MMT of bil hips in low back, resolved without MMT   Behavior During Therapy [facility] for tasks assessed/performed             Past Medical History:  Diagnosis Date   Arthritis    GERD (gastroesophageal reflux disease)    PTSD (post-traumatic stress disorder)    Tubular adenoma of colon    Ulcer 05/05/2010   Peptic Ulcer/ H. Pylori +   Vitamin D deficiency     Past Surgical History:  Procedure Laterality Date   COLONOSCOPY     DEEP NECK LYMPH NODE BIOPSY / EXCISION     ESOPHAGOGASTRODUODENOSCOPY  05/05/2010   +ulcer; +h. Pylori   FOOT SURGERY     TONSILLECTOMY      There were no vitals filed for this visit.    Subjective Assessment - 12/03/20 0848     Subjective Pt reports urinary leakage "for a long time" and pt feels this is getting worse with activities of: coughing, laughing, sneezing, walking, and thinks she urinates ~7 per day. Pt reports often times she will void at [facility] and then stand and has more leakage.    Pertinent History rectocele, fibromyalgia, PTSD    Limitations Sitting;Walking    How long can you sit comfortably? ~1 hour    How long can you stand comfortably? more than one hour    How long can you walk comfortably? ~ 30 mins before back pain starts    Patient Stated Goals to have less leakage    Currently in  Pain? No/denies   but does have back pain radiating into bil legs               [facility] PT Assessment - 12/03/20 0001       Assessment   Medical Diagnosis n39.3    Referring Provider (PT) Everett Graff, MD    Onset Date/Surgical Date --   for years per pt   Prior Therapy yes but not pelvic floor therapy      Precautions   Precautions None      Restrictions   Weight Bearing Restrictions No      Balance Screen   Has the patient fallen in the past 6 months Yes    How many times? 1   tripped   Has the patient had a decrease in activity level because of a fear of falling?  No    Is the patient reluctant to leave their home because of a fear of falling?  No      Home Environment   Living Environment Private residence    Living Arrangements Spouse/significant other;Children    [place] to enter    Entrance Stairs-Number of Steps 2    Additional Comments reports increased back pain with stairs  Prior Function   Level of Independence Independent    Vocation Full time employment    Vocation Requirements works from home and sits a lot for work      Cognition   Overall Cognitive Status Within [employer] for tasks assessed      Sensation   Light Touch Impaired by gross assessment    Additional Comments pins and needles into both legs and sometimes bil feet are numb      Coordination   Gross Motor Movements are Fluid and Coordinated Yes    Fine Motor Movements are Fluid and Coordinated Yes      Posture/Postural Control   Posture/Postural Control Postural limitations    Postural Limitations Posterior pelvic tilt;Rounded Shoulders      ROM / Strength   AROM / PROM / Strength AROM;Strength      AROM   Overall AROM  Deficits    Overall AROM Comments bil hips WFL    AROM Assessment Site Thoracic;Lumbar;Hip    Right/Left Hip Left;Right    Lumbar Flexion limited by 50%    Lumbar Extension limited by 50%    Lumbar - Right Side Bend limited by 50%     Lumbar - Left Side Bend limited by 50%    Lumbar - Right Rotation limited by 75%    Lumbar - Left Rotation limited by 75%    Thoracic Flexion limited by 50%    Thoracic Extension limited by 50%    Thoracic - Right Side Bend limited by 50%    Thoracic - Left Side Bend limited by 50%    Thoracic - Right Rotation limited by 75%    Thoracic - Left Rotation limited by 75%      Strength   Overall Strength Deficits    Overall Strength Comments grossly bil 4-/5 throughout, with not compensatory strategies to complete. With cues to no use these strategies, 3+/5 bil hip abduction noted    Strength Assessment Site Hip    Right/Left Hip Left;Right      Flexibility   Soft Tissue Assessment /Muscle Length yes   bil hamstrings and adductor limited by 25%                       Objective measurements completed on examination: See above findings.     Pelvic Floor Special Questions - 12/03/20 0001     Prior Pelvic/Prostate Exam Yes    Date of Last Pelvic/Prostate Exam --   reported no concerns with this   Are you Pregnant or attempting pregnancy? No    Prior Pregnancies Yes    Number of Pregnancies 2    Number of Vaginal Deliveries 2    Any difficulty with labor and deliveries No    Episiotomy Performed No    Currently Sexually Active Yes    Is this Painful Yes   sometimes, and pain is reported to be deeper. Pt reports she notices vaginal dryness and irritations   History of sexually transmitted disease No    Marinoff Scale discomfort that does not affect completion    Urinary Leakage Yes    How often daily    Pad use only when going out into the community. Changes underwear 2-3x during week    Activities that cause leaking Coughing;Sneezing;Laughing;Walking;Exercising;Lifting    Urinary urgency No    Urinary frequency yes    Fecal incontinence No    Fluid intake no    Caffeine beverages 2-3cups per day of  coffee and a tea sometimes. Bristol stool scale type one without  coffee and type 4 with coffee    Falling out feeling (prolapse) Yes    Activities that cause feeling of prolapse yes   pt reports she does not feel this but MD has told her she has rectocele   Pelvic Floor Internal Exam patient identified and patient confirms consent for PT to perform inernal soft tissue work and muscle strength and integrity assessment    Exam Type Vaginal    Sensation WFL    Palpation no tenderness externally. With internal exam, pt report TTP in anterior/Lt and at bil obturater internus and iliococcygeus    Strength weak squeeze, no lift   2/5 in all quadrants except Rt which was 1/5   Strength # of reps 4    Strength # of seconds 2    Tone decreased                      PT Education - 12/03/20 1501     Education Details Pt educated on female anatomy with model used, exam findings, POC and HEP, proper breathing mechanics.    Person(s) Educated Patient    Methods Explanation;Demonstration;Tactile cues;Verbal cues;Handout    Comprehension Verbalized understanding;Returned demonstration              PT Short Term Goals - 12/03/20 1508       PT SHORT TERM GOAL #1   Title Pt to be I with HEP    Time 8    Period Weeks    Status New    Target Date 01/28/21      PT SHORT TERM GOAL #2   Title Pt to demonstrate at least 3/5 globally at pelvic floor for decreased urinary leakage.    Time 8    Period Weeks    Status New    Target Date 01/28/21      PT SHORT TERM GOAL #3   Title Pt to report no more than x2 instances of urinary leakage per week    Time 8    Period Weeks    Status New    Target Date 01/28/21               PT Long Term Goals - 12/03/20 1510       PT LONG TERM GOAL #1   Title Pt to be I with advanced HEP    Time 4    Period Months    Status New    Target Date 04/04/21      PT LONG TERM GOAL #2   Title pt to demonstrate at least 5/5 global pelvic floor strength for decreased urinary leakage.    Time 4    Period  Months    Status New    Target Date 04/04/21      PT LONG TERM GOAL #3   Title pt to be I with self correction and modifications of proper coordination of core and pelvic floor contract/relax with proper breathing    Time 4    Period Months    Status New    Target Date 04/04/21      PT LONG TERM GOAL #4   Title pt to report no more than 2 instances of urinary leakage per month with activity    Time 4    Period Months    Status New    Target Date 04/04/21  Plan - 12/03/20 1504     Clinical Impression Statement Pt is 55yo female presenting to clinic with reports of chronic urinary leakage with laughing, coughing, sneezing, walking and exericses.    Personal Factors and Comorbidities Time since onset of injury/illness/exacerbation;Fitness;Comorbidity 3+    Comorbidities Rectocele, mutliple vaginal births, and chronic constipation    Examination-Activity Limitations Locomotion Level;Squat;Stairs;Lift;Continence    Examination-Participation Restrictions Community Activity;Yard Work;Shop    Stability/Clinical Decision Making Stable/Uncomplicated    Clinical Decision Making Low    Rehab Potential Good    PT Frequency Biweekly    PT Duration 12 weeks    PT Treatment/Interventions ADLs/Self Care Home Management;Taping;Functional mobility training;Therapeutic activities;Therapeutic exercise;Neuromuscular re-education;Manual techniques;Energy conservation;Patient/family education;Passive range of motion;Scar mobilization    PT Next Visit Plan core/hip strengthening with proper coordination of pelvic floor and core with breath, internal for contract/relax of floor    PT Home Exercise Plan [email or web address]    Consulted and Agree with Plan of Care Patient             Patient will benefit from skilled therapeutic intervention in order to improve the following deficits and impairments:  Decreased coordination, Increased fascial restricitons, Impaired tone, Decreased  endurance, Decreased mobility, Decreased strength, Postural dysfunction, Improper body mechanics, Impaired flexibility, Pain  Visit Diagnosis: Muscle weakness (generalized) - Plan: PT plan of care cert/re-cert  Unspecified lack of coordination - Plan: PT plan of care cert/re-cert  Other abnormalities of gait and mobility - Plan: PT plan of care cert/re-cert     Problem List Patient Active Problem List   Diagnosis Date Noted   Anemia, iron deficiency 03/02/2014   Heme positive stool 03/02/2014   CTS (carpal tunnel syndrome) 06/27/2012   Unspecified vitamin D deficiency 03/19/2012   Multiple joint pain 03/19/2012    Stacy Gardner, PT 08/01/223:14 PM   [facility] [address], [place], [place], [address] Phone: [phone]   Fax:  [phone]  Name: Martha [name] MRN: [id number] Date of Birth: 30-Aug-1965

## 2020-12-18 ENCOUNTER — Other Ambulatory Visit: Payer: Self-pay

## 2020-12-18 ENCOUNTER — Ambulatory Visit: Payer: 59 | Admitting: Physical Therapy

## 2020-12-18 DIAGNOSIS — M6281 Muscle weakness (generalized): Secondary | ICD-10-CM | POA: Diagnosis not present

## 2020-12-18 DIAGNOSIS — R2689 Other abnormalities of gait and mobility: Secondary | ICD-10-CM

## 2020-12-18 DIAGNOSIS — R279 Unspecified lack of coordination: Secondary | ICD-10-CM

## 2020-12-18 NOTE — Therapy (Signed)
[facility] [address], [place], [place], [address] Phone: [phone]   Fax:  [phone]  Physical Therapy Treatment  Patient Details  Name: Martha [name] MRN: [id number] Date of Birth: [birth year] Referring Provider (PT): Everett Graff, MD   Encounter Date: 12/18/2020   PT End of Session - 12/18/20 1009     Visit Number 2    Date for PT Re-Evaluation 04/04/21    Authorization Type UHC    Authorization - Visit Number 2    PT Start Time [id number]    PT Stop Time [id number]    PT Time Calculation (min) 44 min    Activity Tolerance Patient tolerated treatment well;Patient limited by pain    Behavior During Therapy [facility] for tasks assessed/performed             Past Medical History:  Diagnosis Date   Arthritis    GERD (gastroesophageal reflux disease)    PTSD (post-traumatic stress disorder)    Tubular adenoma of colon    Ulcer 05/05/2010   Peptic Ulcer/ H. Pylori +   Vitamin D deficiency     Past Surgical History:  Procedure Laterality Date   COLONOSCOPY     DEEP NECK LYMPH NODE BIOPSY / EXCISION     ESOPHAGOGASTRODUODENOSCOPY  05/05/2010   +ulcer; +h. Pylori   FOOT SURGERY     TONSILLECTOMY      There were no vitals filed for this visit.   Subjective Assessment - 12/18/20 0933     Subjective Pt reports she only had 3-4 instances of urinary leakage last week which is a great improvement from baseline as she was leaking mulitple times each day. Leakage mostly occured with lifting and increased movement with a food dontation this past weekend.    Pertinent History rectocele, fibromyalgia, PTSD    Limitations Sitting;Walking    How long can you sit comfortably? ~1 hour    How long can you stand comfortably? more than one hour    How long can you walk comfortably? ~ 30 mins before back pain starts    Patient Stated Goals to have less leakage    Currently in Pain? No/denies                                [facility] Adult PT Treatment/Exercise - 12/18/20 0001       Self-Care   Self-Care Other Self-Care Comments    Other Self-Care Comments  pt educated on vaginal weights for improved home strengthening and feedback as reported she doesn't feel like she can feel when she is doing to kegel or if it contracting correctly and perferred to attempt this more at home if possible. Pt also shown other options online as well if needed.      Neuro Re-ed    Neuro Re-ed Details  x10 diaphragmatic breathing with pelvic drops and coordinated TA acivations for improved muscle recruitment      Exercises   Exercises Knee/Hip;Lumbar      Lumbar Exercises: Standing   Other Standing Lumbar Exercises palloffs green band x10; rotation palloffs green band x10      Lumbar Exercises: Supine   Bridge 10 reps    Bridge Limitations with TA activations, cues for technique and breathing                    PT Education - 12/18/20 1008     Education  Details Pt educated on vaginal weights, constipation handout given, additions to HEP and proper techniques with all exercies.    Person(s) Educated Patient    Methods Explanation;Demonstration;Tactile cues;Verbal cues;Handout    Comprehension Verbalized understanding;Returned demonstration              PT Short Term Goals - 12/03/20 1508       PT SHORT TERM GOAL #1   Title Pt to be I with HEP    Time 8    Period Weeks    Status New    Target Date 01/28/21      PT SHORT TERM GOAL #2   Title Pt to demonstrate at least 3/5 globally at pelvic floor for decreased urinary leakage.    Time 8    Period Weeks    Status New    Target Date 01/28/21      PT SHORT TERM GOAL #3   Title Pt to report no more than x2 instances of urinary leakage per week    Time 8    Period Weeks    Status New    Target Date 01/28/21               PT Long Term Goals - 12/03/20 1510       PT LONG TERM GOAL #1   Title Pt to be  I with advanced HEP    Time 4    Period Months    Status New    Target Date 04/04/21      PT LONG TERM GOAL #2   Title pt to demonstrate at least 5/5 global pelvic floor strength for decreased urinary leakage.    Time 4    Period Months    Status New    Target Date 04/04/21      PT LONG TERM GOAL #3   Title pt to be I with self correction and modifications of proper coordination of core and pelvic floor contract/relax with proper breathing    Time 4    Period Months    Status New    Target Date 04/04/21      PT LONG TERM GOAL #4   Title pt to report no more than 2 instances of urinary leakage per month with activity    Time 4    Period Months    Status New    Target Date 04/04/21                   Plan - 12/18/20 1013     Clinical Impression Statement Pt reports she has been improving greatly with incontinence symptoms having only x3-4 instances of urinary incontinence in the past week as opposed to multiple leakages daily at baseline. Pt reports she noted these leakages with lifting and increased activity with volunteering at food donation event last week. Pt reports she feels like she can't feel if she is doing kegels correctly and thinks she would need feedback for this but more comfortable doing this at home as able for extra exercises to futher improve this. Pt educated on vaginal weights and given handout, also shown online different options as well. Pt motivated to attempt this. Pt session focused on core strengthening, educated on additions to HEP, went through these with exercises in clinic with pt able to complete no pain and in proper form with cues. Pt also directed in proper core/pelvic floor contraction/relaxations with breathing mechanics with tactile facilitation benefited for form and technique. Pt would benefit from continued  PT for improved flexibility, decreased leakage, increased strength, proper breathing mechanics and rib mobility.    Personal Factors  and Comorbidities Time since onset of injury/illness/exacerbation;Fitness;Comorbidity 3+    Comorbidities Rectocele, mutliple vaginal births, and chronic constipation    Examination-Activity Limitations Locomotion Level;Squat;Stairs;Lift;Continence    Examination-Participation Restrictions Community Activity;Yard Work;Shop    Stability/Clinical Decision Making Stable/Uncomplicated    Clinical Decision Making Low    Rehab Potential Good    PT Frequency Biweekly    PT Duration 12 weeks    PT Treatment/Interventions ADLs/Self Care Home Management;Taping;Functional mobility training;Therapeutic activities;Therapeutic exercise;Neuromuscular re-education;Manual techniques;Energy conservation;Patient/family education;Passive range of motion;Scar mobilization    PT Next Visit Plan core/hip strengthening with proper coordination of pelvic floor and core with breath, internal for contract/relax of floor    PT Home Exercise Plan [email or web address]    Consulted and Agree with Plan of Care Patient             Patient will benefit from skilled therapeutic intervention in order to improve the following deficits and impairments:  Decreased coordination, Increased fascial restricitons, Impaired tone, Decreased endurance, Decreased mobility, Decreased strength, Postural dysfunction, Improper body mechanics, Impaired flexibility, Pain  Visit Diagnosis: Lack of coordination  Muscle weakness (generalized)  Other abnormalities of gait and mobility     Problem List Patient Active Problem List   Diagnosis Date Noted   Anemia, iron deficiency 03/02/2014   Heme positive stool 03/02/2014   CTS (carpal tunnel syndrome) 06/27/2012   Unspecified vitamin D deficiency 03/19/2012   Multiple joint pain 03/19/2012    Stacy Gardner, PT 12/18/2208:19 AM   [facility] [address], [place], [place], [address] Phone: [phone]   Fax:   [phone]  Name: Martha [name] MRN: [id number] Date of Birth: 01-08-1966

## 2020-12-18 NOTE — Patient Instructions (Addendum)
About Constipation  Constipation Overview Constipation is the most common gastrointestinal complaint -- about 4 million Americans experience constipation and make 2.5 million physician visits a year to get help for the problem.  Constipation can occur when the colon absorbs too much water, the colon's muscle contraction is slow or sluggish, and/or there is delayed transit time through the colon.  The result is stool that is hard and dry.  Indicators of constipation include straining during bowel movements greater than 25% of the time, having fewer than three bowel movements per week, and/or the feeling of incomplete evacuation.  There are established guidelines ([place] II ) for defining constipation. A person needs to have two or more of the following symptoms for at least 12 weeks (not necessarily consecutive) in the preceding 12 months: Straining in  greater than 25% of bowel movements Lumpy or hard stools in greater than 25% of bowel movements Sensation of incomplete emptying in greater than 25% of bowel movements Sensation of anorectal obstruction/blockade in greater than 25% of bowel movements Manual maneuvers to help empty greater than 25% of bowel movements (e.g., digital evacuation, support of the pelvic floor)  Less than  3 bowel movements/week Loose stools are not present, and criteria for irritable bowel syndrome are insufficient Common Causes of Constipation lack of fiber in your diet lack of physical activity medications, including iron and calcium supplements  dairy intake dehydration abuse of laxatives travel Irritable Bowel Syndrome pregnancy luteal phase of menstruation (after ovulation and before menses) colorectal problems intestinal dysfunction  Treating Constipation  There are several ways of treating constipation, including changes to diet and exercise, use of laxatives, adjustments to the pelvic floor, and scheduled toileting.  These treatments include: increasing  fiber and fluids in the diet  increasing physical activity learning muscle coordination  learning proper toileting techniques and toileting modifications  designing and sticking  to a toileting schedule   A high fiber diet with plenty of fluids (up to 8 glasses of water daily) is suggested to relieve these symptoms.  Metamucil, 1 tablespoon once or twice daily can be used to keep bowels regular if needed.   Access Code: [email or web address] URL: [email or web address] Date: 12/18/2020 Prepared by: [facility]  Exercises Supine Diaphragmatic Breathing - 1 x daily - 7 x weekly - 1 sets - 10 reps Seated Hamstring Stretch - 1 x daily - 7 x weekly - 1 sets - 2-5 reps - 45s hold Supine Hip Adductor Stretch - 1 x daily - 7 x weekly - 1 sets - 2-5 reps - 45 hold Supine Pelvic Floor Stretch - 1 x daily - 7 x weekly - 1 sets - 2-5 reps - 45 hold Modified Thomas Stretch - 1 x daily - 7 x weekly - 1 sets - 2-5 reps - 45s hold Hooklying Transversus Abdominis Palpation - 1 x daily - 7 x weekly - 1 sets - 10 reps - 2-3s holds Supine Lower Trunk Rotation - 1 x daily - 7 x weekly - 1 sets - 10 reps - 10s holds Deep Squat with Pelvic Floor Relaxation - 1 x daily - 7 x weekly - 1 sets - 3-5 reps - 30-45s holds Supine Bridge - 1 x daily - 7 x weekly - 2 sets - 10 reps Hooklying Clamshell with Resistance - 1 x daily - 7 x weekly - 2 sets - 10 reps Standing Anti-Rotation Press with Anchored Resistance - 1 x daily - 7 x weekly - 2 sets -  10 reps

## 2021-01-02 ENCOUNTER — Ambulatory Visit: Payer: 59 | Admitting: Physical Therapy

## 2021-01-02 ENCOUNTER — Other Ambulatory Visit: Payer: Self-pay

## 2021-01-02 DIAGNOSIS — R279 Unspecified lack of coordination: Secondary | ICD-10-CM

## 2021-01-02 DIAGNOSIS — M6281 Muscle weakness (generalized): Secondary | ICD-10-CM | POA: Diagnosis not present

## 2021-01-02 DIAGNOSIS — R2689 Other abnormalities of gait and mobility: Secondary | ICD-10-CM

## 2021-01-02 NOTE — Therapy (Signed)
[facility] [address] [place], [place], [address] Phone: [phone]   Fax:  [phone]  Physical Therapy Treatment  Patient Details  Name: Martha [name] MRN: [id number] Date of Birth: March 30, 1966 Referring Provider (PT): Everett Graff, MD   Encounter Date: 01/02/2021   PT End of Session - 01/02/21 0928     Visit Number 3    Date for PT Re-Evaluation 04/04/21    Authorization Type UHC    Authorization - Visit Number 3    PT Start Time 0930    PT Stop Time 1012    PT Time Calculation (min) 42 min    Activity Tolerance Patient tolerated treatment well;Patient limited by pain    Behavior During Therapy [facility] for tasks assessed/performed             Past Medical History:  Diagnosis Date   Arthritis    GERD (gastroesophageal reflux disease)    PTSD (post-traumatic stress disorder)    Tubular adenoma of colon    Ulcer 05/05/2010   Peptic Ulcer/ H. Pylori +   Vitamin D deficiency     Past Surgical History:  Procedure Laterality Date   COLONOSCOPY     DEEP NECK LYMPH NODE BIOPSY / EXCISION     ESOPHAGOGASTRODUODENOSCOPY  05/05/2010   +ulcer; +h. Pylori   FOOT SURGERY     TONSILLECTOMY      There were no vitals filed for this visit.   Subjective Assessment - 01/02/21 0928     Subjective Pt reports she ordered vaginal weights and havent tried them yet. Pt reports leakage has continued only a couple times a week with sneezing, cough, bending down (thress seperate instances). Pt reports she thinks the knack method is helping overall and she is getting more coordinated with this and able to go consistently between 2-4 hours for bladder voids.    Pertinent History rectocele, fibromyalgia, PTSD    Limitations Sitting;Walking    How long can you sit comfortably? 3 hours    How long can you stand comfortably? ~ 1 hour    How long can you walk comfortably? ~30 mins    Patient Stated Goals to have less leakage     Currently in Pain? No/denies                               [facility] Adult PT Treatment/Exercise - 01/02/21 0001       Self-Care   Self-Care Other Self-Care Comments    Other Self-Care Comments  pt educated on vaginal weights and how to start training at home and progress at home as able. pt denied additional questions.      Exercises   Exercises Knee/Hip;Lumbar      Lumbar Exercises: Aerobic   Elliptical 6 mins L2      Lumbar Exercises: Standing   Functional Squats 10 reps    Functional Squats Limitations 10# kettlebell    Other Standing Lumbar Exercises palloffs 15# power tower x10    Other Standing Lumbar Exercises rotation palloffs 15# power tower x10; x10 reverse mini lunge with unilateral row in opp arm 15# power tower each side      Lumbar Exercises: Supine   Bridge 20 reps    Bridge Limitations with TA activations, cues for technique and breathing. x10 with red band for resistance over anterior pelvis    Other Supine Lumbar Exercises alternating opp arm/knee oblique press x10  each                    PT Education - 01/02/21 [phone]     Education Details Pt educated on how to progress and find appropiate size vaginal weights to start with at home.    Person(s) Educated Patient    Methods Explanation;Demonstration;Tactile cues;Verbal cues    Comprehension Verbalized understanding;Returned demonstration              PT Short Term Goals - 12/03/20 1508       PT SHORT TERM GOAL #1   Title Pt to be I with HEP    Time 8    Period Weeks    Status New    Target Date 01/28/21      PT SHORT TERM GOAL #2   Title Pt to demonstrate at least 3/5 globally at pelvic floor for decreased urinary leakage.    Time 8    Period Weeks    Status New    Target Date 01/28/21      PT SHORT TERM GOAL #3   Title Pt to report no more than x2 instances of urinary leakage per week    Time 8    Period Weeks    Status New    Target Date 01/28/21                PT Long Term Goals - 12/03/20 1510       PT LONG TERM GOAL #1   Title Pt to be I with advanced HEP    Time 4    Period Months    Status New    Target Date 04/04/21      PT LONG TERM GOAL #2   Title pt to demonstrate at least 5/5 global pelvic floor strength for decreased urinary leakage.    Time 4    Period Months    Status New    Target Date 04/04/21      PT LONG TERM GOAL #3   Title pt to be I with self correction and modifications of proper coordination of core and pelvic floor contract/relax with proper breathing    Time 4    Period Months    Status New    Target Date 04/04/21      PT LONG TERM GOAL #4   Title pt to report no more than 2 instances of urinary leakage per month with activity    Time 4    Period Months    Status New    Target Date 04/04/21                   Plan - 01/02/21 [id number]     Clinical Impression Statement Pt presents to clinic reporting she feels she is getting better with symptoms but has had 3 instances of leakage this past week and feels she leaks 2-3 times a week. Pt reports this is much better than prior to PT and only with increased stresses of strong cough and sneeze and bending forward with full bladder. Pt is urinating between 2-4 hours now consistently. Pt has purchased vaginal weights for home pelvic floor strengthening, pt educated on how to start this and progress safely and in various positions. Pt session focused on core and hip strengthening with proper breathing mechanics and core/pelvic floor coordination with all exercises. Pt benefits from cues intermittently for improved technique. Pt would benefit from continued PT for improved flexibility, decreased leakage, increased strength, proper breathing  mechanics and rib mobility.    Personal Factors and Comorbidities Time since onset of injury/illness/exacerbation;Fitness;Comorbidity 3+    Comorbidities Rectocele, mutliple vaginal births, and chronic constipation     Examination-Activity Limitations Locomotion Level;Squat;Stairs;Lift;Continence    Examination-Participation Restrictions Community Activity;Yard Work;Shop    Stability/Clinical Decision Making Stable/Uncomplicated    Clinical Decision Making Low    Rehab Potential Good    PT Frequency Biweekly    PT Duration 12 weeks    PT Treatment/Interventions ADLs/Self Care Home Management;Taping;Functional mobility training;Therapeutic activities;Therapeutic exercise;Neuromuscular re-education;Manual techniques;Energy conservation;Patient/family education;Passive range of motion;Scar mobilization;Aquatic Therapy;Dry needling;Vasopneumatic Device;Joint Manipulations;Spinal Manipulations    PT Next Visit Plan core/hip strengthening with proper coordination of pelvic floor and core with breath, internal for contract/relax of floor    PT Home Exercise Plan [email or web address]    Consulted and Agree with Plan of Care Patient             Patient will benefit from skilled therapeutic intervention in order to improve the following deficits and impairments:  Decreased coordination, Increased fascial restricitons, Impaired tone, Decreased endurance, Decreased mobility, Decreased strength, Postural dysfunction, Improper body mechanics, Impaired flexibility, Pain  Visit Diagnosis: Muscle weakness (generalized)  Other abnormalities of gait and mobility  Lack of coordination     Problem List Patient Active Problem List   Diagnosis Date Noted   Anemia, iron deficiency 03/02/2014   Heme positive stool 03/02/2014   CTS (carpal tunnel syndrome) 06/27/2012   Unspecified vitamin D deficiency 03/19/2012   Multiple joint pain 03/19/2012    Stacy Gardner, PT, DPT 01/02/2209:15 AM    [facility] [address], [place], [place], [address] Phone: [phone]   Fax:  [phone]  Name: Martha [name] MRN: [id number] Date of Birth: 08-29-1965

## 2021-01-14 ENCOUNTER — Other Ambulatory Visit: Payer: Self-pay

## 2021-01-14 ENCOUNTER — Emergency Department (HOSPITAL_COMMUNITY): Payer: No Typology Code available for payment source

## 2021-01-14 ENCOUNTER — Encounter (HOSPITAL_COMMUNITY): Payer: Self-pay

## 2021-01-14 ENCOUNTER — Emergency Department (HOSPITAL_COMMUNITY)
Admission: EM | Admit: 2021-01-14 | Discharge: 2021-01-15 | Disposition: A | Discharge status: Home / Self Care | Payer: No Typology Code available for payment source | Attending: Emergency Medicine | Admitting: Emergency Medicine

## 2021-01-14 DIAGNOSIS — R4781 Slurred speech: Secondary | ICD-10-CM | POA: Diagnosis present

## 2021-01-14 DIAGNOSIS — Z5321 Procedure and treatment not carried out due to patient leaving prior to being seen by health care provider: Secondary | ICD-10-CM | POA: Insufficient documentation

## 2021-01-14 LAB — COMPREHENSIVE METABOLIC PANEL
ALT: 14 U/L (ref 0–44)
AST: 17 U/L (ref 15–41)
Albumin: 3.7 g/dL (ref 3.5–5.0)
Alkaline Phosphatase: 94 U/L (ref 38–126)
Anion gap: 8 (ref 5–15)
BUN: 8 mg/dL (ref 6–20)
CO2: 25 mmol/L (ref 22–32)
Calcium: 9.1 mg/dL (ref 8.9–10.3)
Chloride: 104 mmol/L (ref 98–111)
Creatinine, Ser: 0.63 mg/dL (ref 0.44–1.00)
GFR, Estimated: >60 mL/min (ref >60)
Glucose, Bld: 91 mg/dL (ref 70–99)
Potassium: 3.6 mmol/L (ref 3.5–5.1)
Sodium: 137 mmol/L (ref 135–145)
Total Bilirubin: 0.6 mg/dL (ref 0.3–1.2)
Total Protein: 7.3 g/dL (ref 6.5–8.1)

## 2021-01-14 LAB — CBC WITH DIFFERENTIAL/PLATELET
Abs Immature Granulocytes: 0.02 10*3/uL (ref 0.00–0.07)
Basophils Absolute: 0 10*3/uL (ref 0.0–0.1)
Basophils Relative: 1 %
Eosinophils Absolute: 0.2 10*3/uL (ref 0.0–0.5)
Eosinophils Relative: 2 %
HCT: 41.2 % (ref 36.0–46.0)
Hemoglobin: 12.7 g/dL (ref 12.0–15.0)
Immature Granulocytes: 0 %
Lymphocytes Relative: 43 %
Lymphs Abs: 2.9 10*3/uL (ref 0.7–4.0)
MCH: 25.8 pg — ABNORMAL LOW (ref 26.0–34.0)
MCHC: 30.8 g/dL (ref 30.0–36.0)
MCV: 83.7 fL (ref 80.0–100.0)
Monocytes Absolute: 0.6 10*3/uL (ref 0.1–1.0)
Monocytes Relative: 8 %
Neutro Abs: 3 10*3/uL (ref 1.7–7.7)
Neutrophils Relative %: 46 %
Platelets: 318 10*3/uL (ref 150–400)
RBC: 4.92 MIL/uL (ref 3.87–5.11)
RDW: 13.6 % (ref 11.5–15.5)
WBC: 6.6 10*3/uL (ref 4.0–10.5)
nRBC: 0 % (ref 0.0–0.2)

## 2021-01-14 NOTE — ED Triage Notes (Signed)
Pt is alert and orientedx4 not having trouble giving me hx or hx of this episode of slurred speech, never had this problem before

## 2021-01-14 NOTE — ED Provider Notes (Signed)
Emergency Medicine Provider Triage Evaluation Note  Martha [name] , a 55 y.o. female  was evaluated in triage.  \This morning around 10 AM patient had 2 episodes of word finding difficulties also slightly on the phone.  She did not think much of it however when she got her [place] doctor to check on her lab she mention this to them.  They sent her to the emergency room for work-up of neurological injury.  Patient has no weakness in any extremity.  No numbness in extremity.  No chest pain or shortness of breath.  She does have slight headache in her occipital head.  She has had history of migraines since she was in college however this does not feel like the same headache as a migraine.  Review of Systems  Positive: Aphasia, headache Negative: Chest pain, shortness of breath, fever  Physical Exam  BP 117/74 (BP Location: Right Arm)   Pulse 76   Temp 98.2 F (36.8 C) (Oral)   Resp 16   SpO2 99%  Gen:   Awake, no distress  Resp:  Normal effort  MSK:   Moves extremities without difficulty  Other:  Neuro exam is intact.  Motor strength equal in all extremities.  Sensation present in all extremities.  Cranial nerves II through XII are all intact.  Patient does not have any symptoms of word finding difficulties or dysphagia during exam.  Medical Decision Making  Medically screening exam initiated at 10:01 PM.  Appropriate orders placed.  Martha [name] was informed that the remainder of the evaluation will be completed by another provider, this initial triage assessment does not replace that evaluation, and the importance of remaining in the ED until their evaluation is complete.    Sheila Oats 01/14/21 2203    Dorie Rank, MD 01/16/21 [phone]

## 2021-01-15 ENCOUNTER — Encounter: Payer: No Typology Code available for payment source | Admitting: Physical Therapy

## 2021-01-15 NOTE — ED Notes (Signed)
PT decided to leave , PT stated she will follow up on my chart.

## 2021-01-23 ENCOUNTER — Other Ambulatory Visit: Payer: Self-pay

## 2021-01-23 ENCOUNTER — Ambulatory Visit: Payer: 59 | Attending: Obstetrics and Gynecology | Admitting: Physical Therapy

## 2021-01-23 ENCOUNTER — Encounter: Payer: Self-pay | Admitting: Physical Therapy

## 2021-01-23 DIAGNOSIS — R293 Abnormal posture: Secondary | ICD-10-CM

## 2021-01-23 DIAGNOSIS — M6281 Muscle weakness (generalized): Secondary | ICD-10-CM

## 2021-01-23 DIAGNOSIS — R279 Unspecified lack of coordination: Secondary | ICD-10-CM | POA: Diagnosis present

## 2021-01-23 NOTE — Patient Instructions (Signed)
Urge Incontinence  Ideal urination frequency is every 2-4 wakeful hours, which equates to 5-8 times within a 24-hour period.   Urge incontinence is leakage that occurs when the bladder muscle contracts, creating a sudden need to go before getting to the bathroom.   Going too often when your bladder isn't actually full can disrupt the body's automatic signals to store and hold urine longer, which will increase urgency/frequency.  In this case, the bladder "is running the show" and strategies can be learned to retrain this pattern.   One should be able to control the first urge to urinate, at around 150mL.  The bladder can hold up to a "grande latte," or 400mL. To help you gain control, practice the Urge Drill below when urgency strikes.  This drill will help retrain your bladder signals and allow you to store and hold urine longer.  The overall goal is to stretch out your time between voids to reach a more manageable voiding schedule.    Practice your "quick flicks" often throughout the day (each waking hour) even when you don't need feel the urge to go.  This will help strengthen your pelvic floor muscles, making them more effective in controlling leakage.  Urge Drill  When you feel an urge to go, follow these steps to regain control: Stop what you are doing and be still Take one deep breath, directing your air into your abdomen Think an affirming thought, such as "I've got this." Do 5 quick flicks of your pelvic floor Walk with control to the bathroom to void, or delay voiding   Relaxation Exercises with the Urge to Void   When you experience an urge to void:  FIRST  Stop and stand very still    Sit down if you can    Don't move    You need to stay very still to maintain control  SECOND Squeeze your pelvic floor muscles 5 times, like a quick flick, to keep from leaking  THIRD Relax  Take a deep breath and then let it out  Try to make the urge go away by using relaxation and  visualization techniques  FINALLY When you feel the urge go away somewhat, walk normally to the bathroom.   If the urge gets suddenly stronger on the way, you may stop again and relax to regain control.  

## 2021-01-23 NOTE — Therapy (Signed)
[facility] [address] [place], [place], [address] Phone: [phone]   Fax:  [phone]  Physical Therapy Treatment  Patient Details  Name: Martha [name] MRN: [id number] Date of Birth: 1966-02-06 Referring Provider (PT): Everett Graff, MD   Encounter Date: 01/23/2021   PT End of Session - 01/23/21 1138     Visit Number 4    Date for PT Re-Evaluation 04/04/21    Authorization Type UHC    Authorization - Visit Number 4    PT Start Time 1104    PT Stop Time 1142    PT Time Calculation (min) 38 min    Activity Tolerance Patient tolerated treatment well;Patient limited by pain    Behavior During Therapy [facility] for tasks assessed/performed             Past Medical History:  Diagnosis Date   Arthritis    GERD (gastroesophageal reflux disease)    PTSD (post-traumatic stress disorder)    Tubular adenoma of colon    Ulcer 05/05/2010   Peptic Ulcer/ H. Pylori +   Vitamin D deficiency     Past Surgical History:  Procedure Laterality Date   COLONOSCOPY     DEEP NECK LYMPH NODE BIOPSY / EXCISION     ESOPHAGOGASTRODUODENOSCOPY  05/05/2010   +ulcer; +h. Pylori   FOOT SURGERY     TONSILLECTOMY      There were no vitals filed for this visit.   Subjective Assessment - 01/23/21 1106     Subjective Pt reports she had recent aphasic episode 9/12 went to ED and cleared with imaging and no longer having any of these symptoms. Pt reports she had one small urine leak while trying to get cats out from under her car. Pt reported she felt much less leaking prior to this past week, with ER visit reports set back and had more leakage.    Pertinent History rectocele, fibromyalgia, PTSD    Limitations Sitting;Walking    How long can you sit comfortably? 3 hours    How long can you stand comfortably? ~ 1 hour    How long can you walk comfortably? ~30 mins    Patient Stated Goals to have less leakage    Currently in Pain?  No/denies                               [facility] Adult PT Treatment/Exercise - 01/23/21 0001       Neuro Re-ed    Neuro Re-ed Details  x10 diaphragmatic breathing with pelvic drops and coordinated TA acivations for improved muscle recruitment      Exercises   Exercises Knee/Hip;Lumbar      Lumbar Exercises: Standing   Functional Squats 10 reps    Functional Squats Limitations 10# kettlebell    Other Standing Lumbar Exercises palloffs with green band x10    Other Standing Lumbar Exercises rotation palloffs with green band  x10; x10 reverse mini lunge with unilateral row in opp arm 15# power tower each side      Lumbar Exercises: Quadruped   Opposite Arm/Leg Raise Right arm/Left leg;Left arm/Right leg;10 reps;1 second    Opposite Arm/Leg Raise Limitations tactile cues to keep core and pelvic floor engaged      Knee/Hip Exercises: Standing   Forward Lunges Right;Left;5 sets  PT Education - 01/23/21 1141     Education Details pt educated on urge drill, the knack method, print outs given.    Person(s) Educated Patient    Methods Explanation;Demonstration;Tactile cues;Verbal cues;Handout    Comprehension Verbalized understanding;Returned demonstration              PT Short Term Goals - 12/03/20 1508       PT SHORT TERM GOAL #1   Title Pt to be I with HEP    Time 8    Period Weeks    Status New    Target Date 01/28/21      PT SHORT TERM GOAL #2   Title Pt to demonstrate at least 3/5 globally at pelvic floor for decreased urinary leakage.    Time 8    Period Weeks    Status New    Target Date 01/28/21      PT SHORT TERM GOAL #3   Title Pt to report no more than x2 instances of urinary leakage per week    Time 8    Period Weeks    Status New    Target Date 01/28/21               PT Long Term Goals - 12/03/20 1510       PT LONG TERM GOAL #1   Title Pt to be I with advanced HEP    Time 4    Period Months     Status New    Target Date 04/04/21      PT LONG TERM GOAL #2   Title pt to demonstrate at least 5/5 global pelvic floor strength for decreased urinary leakage.    Time 4    Period Months    Status New    Target Date 04/04/21      PT LONG TERM GOAL #3   Title pt to be I with self correction and modifications of proper coordination of core and pelvic floor contract/relax with proper breathing    Time 4    Period Months    Status New    Target Date 04/04/21      PT LONG TERM GOAL #4   Title pt to report no more than 2 instances of urinary leakage per month with activity    Time 4    Period Months    Status New    Target Date 04/04/21                   Plan - 01/23/21 1139     Clinical Impression Statement Pt presents to clinic with reports of having a setback last week and did have more leakage but unable to do HEP and wasn't really practicing bladder retraining due to having a health scare and needing to go to ED. Now that this has calmed down she knows she had more leakage lately but prior to that was doing much better and having way less leaks. Pt session focused on education about vaginal weights at home, urge drill, the knack method and bladder retraining as well as hip and core strengthening for improved breathing mechanics and pelvic relax/contraction to decrease leakage. pt had one small leak with first squat and then with cues for beathing and pelvic contraction no more leaks. Pt benefits from cues intermittently for improved technique. Pt would benefit from continued PT for improved flexibility, decreased leakage, increased strength, proper breathing mechanics and rib mobility.    Personal Factors and Comorbidities Time since  onset of injury/illness/exacerbation;Fitness;Comorbidity 3+    Comorbidities Rectocele, mutliple vaginal births, and chronic constipation    Examination-Activity Limitations Locomotion Level;Squat;Stairs;Lift;Continence     Examination-Participation Restrictions Community Activity;Yard Work;Shop    Stability/Clinical Decision Making Stable/Uncomplicated    Clinical Decision Making Low    Rehab Potential Good    PT Frequency Biweekly    PT Duration 12 weeks    PT Treatment/Interventions ADLs/Self Care Home Management;Taping;Functional mobility training;Therapeutic activities;Therapeutic exercise;Neuromuscular re-education;Manual techniques;Energy conservation;Patient/family education;Passive range of motion;Scar mobilization;Aquatic Therapy;Dry needling;Vasopneumatic Device;Joint Manipulations;Spinal Manipulations    PT Next Visit Plan core/hip strengthening with proper coordination of pelvic floor and core with breath, internal for contract/relax of floor    PT Home Exercise Plan [email or web address]    Consulted and Agree with Plan of Care Patient             Patient will benefit from skilled therapeutic intervention in order to improve the following deficits and impairments:  Decreased coordination, Increased fascial restricitons, Impaired tone, Decreased endurance, Decreased mobility, Decreased strength, Postural dysfunction, Improper body mechanics, Impaired flexibility, Pain  Visit Diagnosis: Lack of coordination  Muscle weakness (generalized)  Abnormal posture     Problem List Patient Active Problem List   Diagnosis Date Noted   Anemia, iron deficiency 03/02/2014   Heme positive stool 03/02/2014   CTS (carpal tunnel syndrome) 06/27/2012   Unspecified vitamin D deficiency 03/19/2012   Multiple joint pain 03/19/2012    Stacy Gardner, PT, DPT 01/24/2211:27 PM   [facility] [address], [place], [place], [address] Phone: [phone]   Fax:  [phone]  Name: Kanita Delage MRN: [id number] Date of Birth: 17-Sep-1965

## 2021-01-28 ENCOUNTER — Ambulatory Visit: Payer: 59 | Admitting: Physical Therapy

## 2021-01-28 ENCOUNTER — Telehealth: Payer: Self-pay | Admitting: Physical Therapy

## 2021-01-28 NOTE — Telephone Encounter (Signed)
PT spoke with pt about appt this morning at 1015, pt had written date down as 9/27 instead of 9/26 as she is usually scheduled on tuesdays. Pt to call back and reschedule.   Stacy Gardner, PT, DPT 01/28/2209:40 AM

## 2021-07-10 DIAGNOSIS — K219 Gastro-esophageal reflux disease without esophagitis: Secondary | ICD-10-CM | POA: Insufficient documentation

## 2021-07-10 DIAGNOSIS — R09A2 Foreign body sensation, throat: Secondary | ICD-10-CM | POA: Insufficient documentation

## 2022-05-05 HISTORY — PX: CHOLECYSTECTOMY: SHX55

## 2022-06-18 ENCOUNTER — Other Ambulatory Visit: Payer: Self-pay | Admitting: Orthopaedic Surgery

## 2022-06-18 DIAGNOSIS — M47816 Spondylosis without myelopathy or radiculopathy, lumbar region: Secondary | ICD-10-CM

## 2022-07-09 ENCOUNTER — Ambulatory Visit
Admission: RE | Admit: 2022-07-09 | Discharge: 2022-07-09 | Disposition: A | Discharge status: Home / Self Care | Payer: No Typology Code available for payment source | Source: Ambulatory Visit | Attending: Orthopaedic Surgery | Admitting: Orthopaedic Surgery

## 2022-07-09 DIAGNOSIS — M47816 Spondylosis without myelopathy or radiculopathy, lumbar region: Secondary | ICD-10-CM

## 2022-08-28 IMAGING — CR DG CHEST 2V
2 series · 2 of 2 positions shown · non-contrast
Comparison: 05/16/2008

CLINICAL DATA: Chest pain.  Shortness of breath and dizziness.

EXAM:
CHEST - 2 VIEW

[chest pa]
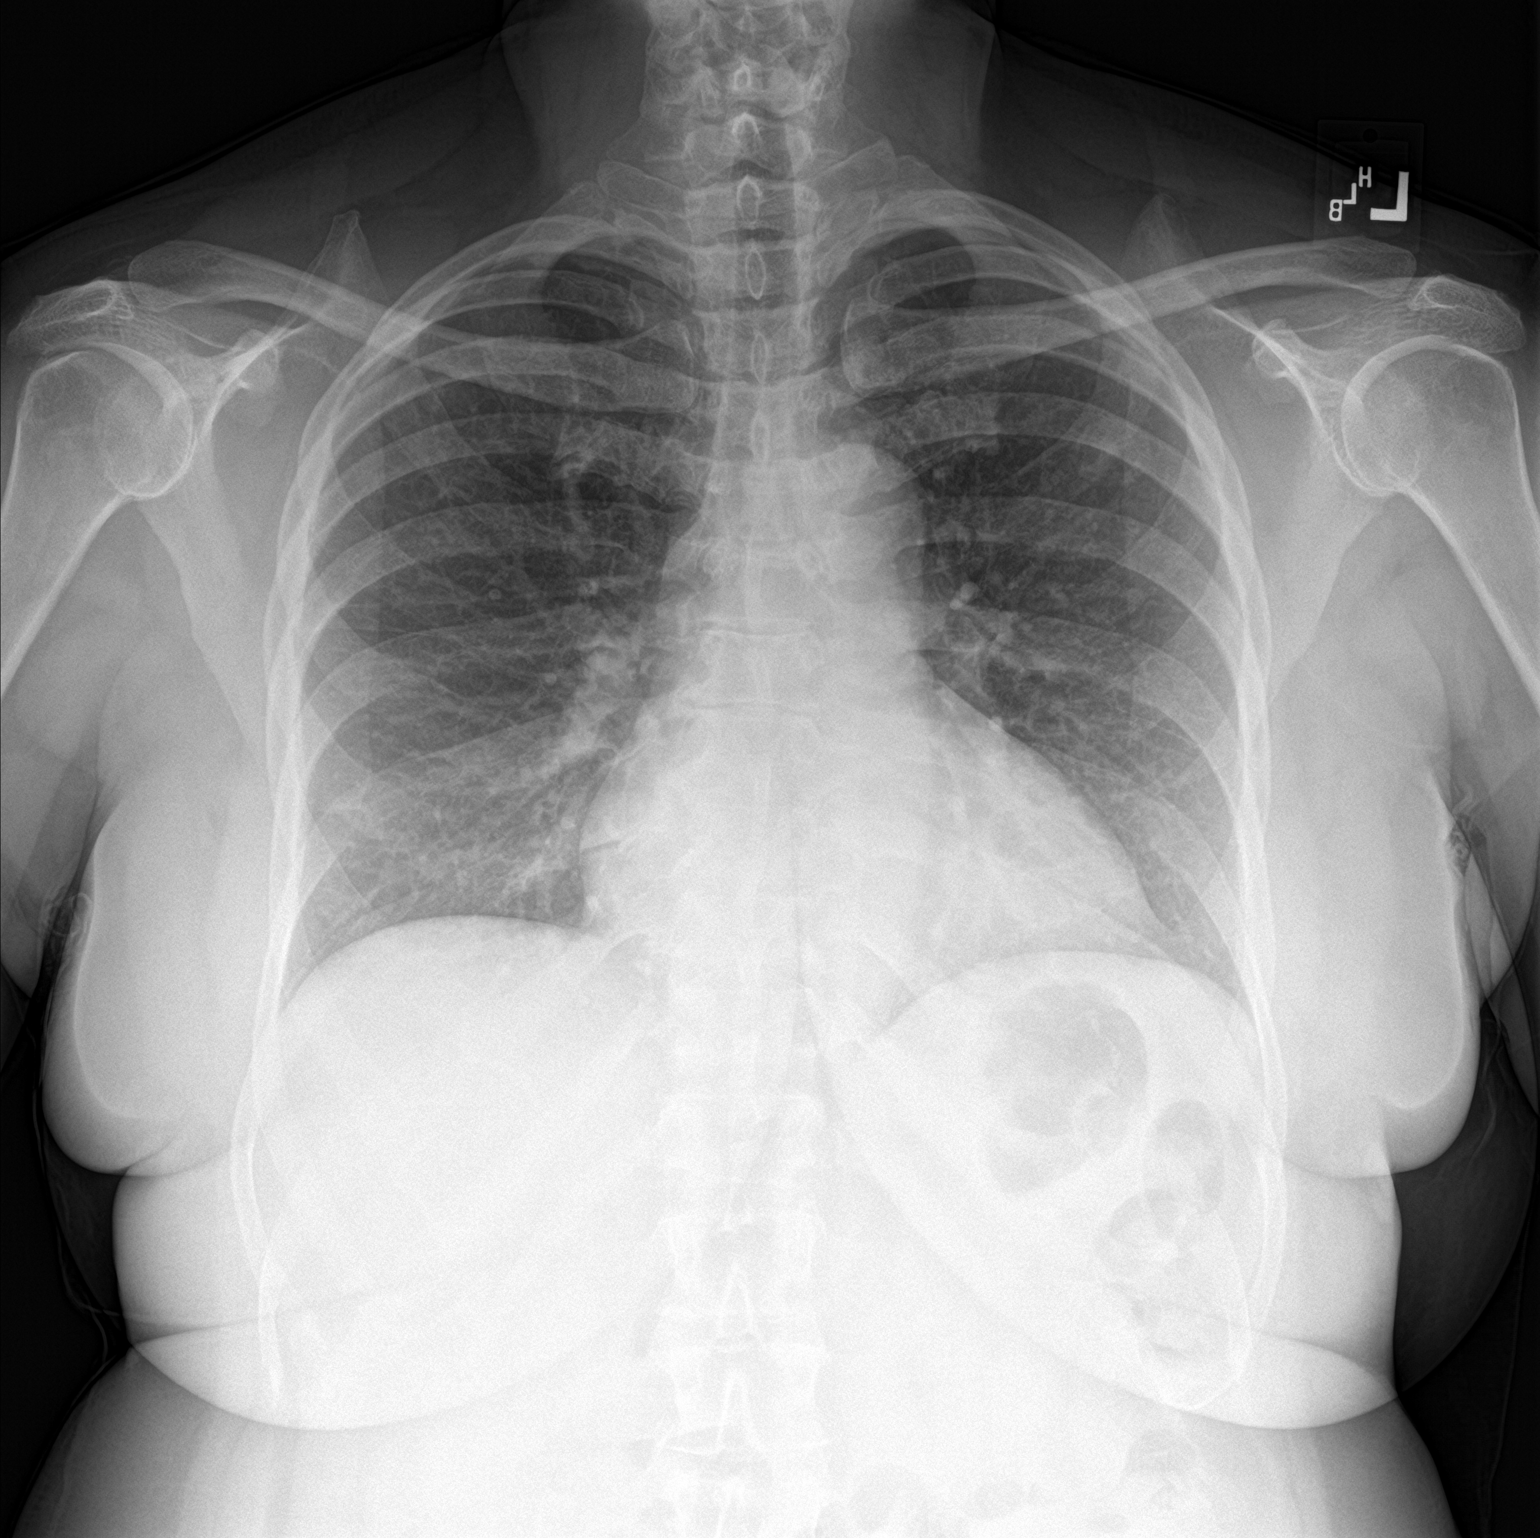

[chest lat]
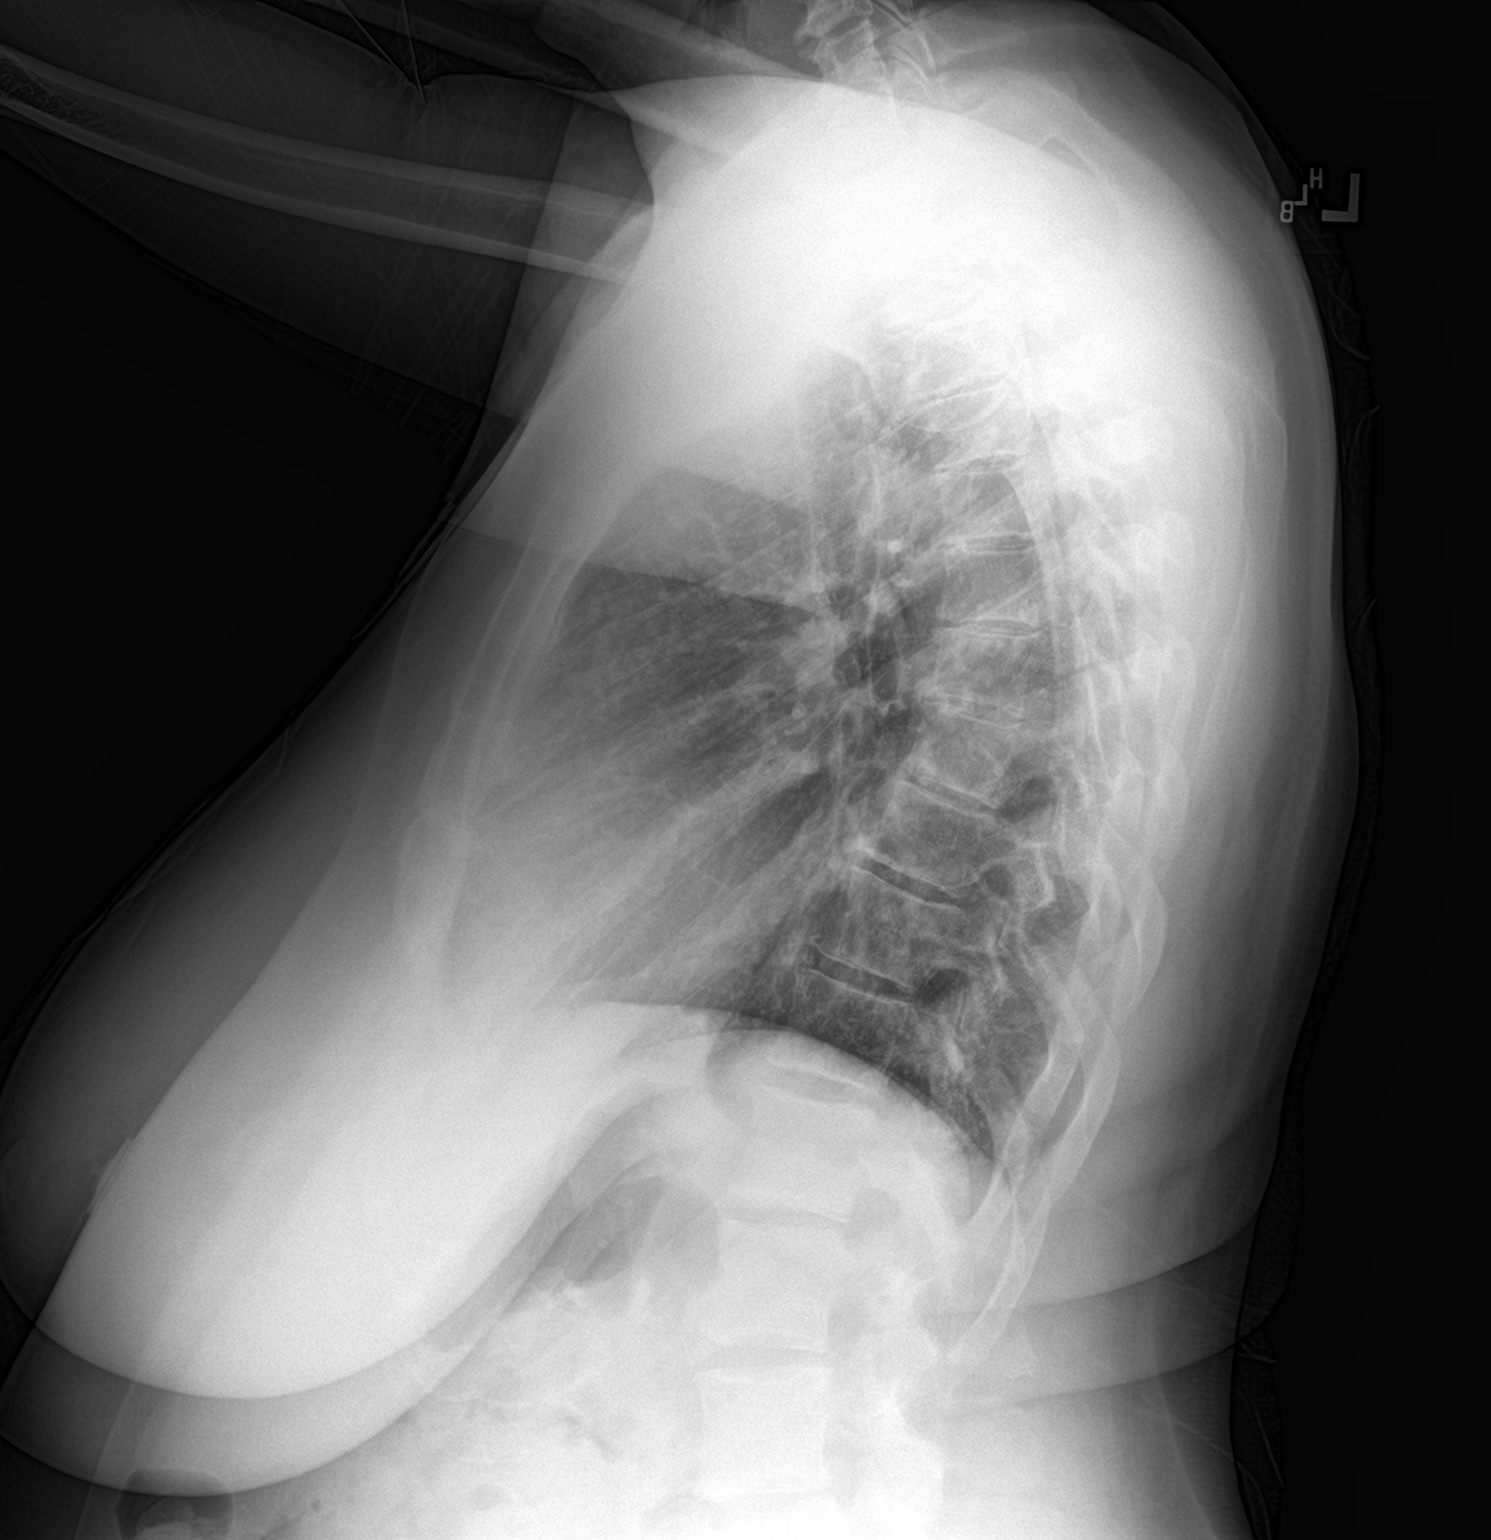

[2 of 2 positions shown; findings below may reference images not displayed]

FINDINGS: The heart size and mediastinal contours are within normal limits.
Both lungs are clear. The visualized skeletal structures are
unremarkable.
IMPRESSION: No active cardiopulmonary disease.

## 2023-05-22 ENCOUNTER — Ambulatory Visit: Payer: 59 | Admitting: Internal Medicine

## 2023-05-22 ENCOUNTER — Encounter: Payer: Self-pay | Admitting: Internal Medicine

## 2023-05-22 VITALS — BP 121/75 | HR 65 | Temp 97.6°F | Ht 65.0 in | Wt 180.0 lb

## 2023-05-22 DIAGNOSIS — F418 Other specified anxiety disorders: Secondary | ICD-10-CM

## 2023-05-22 DIAGNOSIS — M51362 Other intervertebral disc degeneration, lumbar region with discogenic back pain and lower extremity pain: Secondary | ICD-10-CM

## 2023-05-22 DIAGNOSIS — K862 Cyst of pancreas: Secondary | ICD-10-CM | POA: Diagnosis not present

## 2023-05-22 DIAGNOSIS — D508 Other iron deficiency anemias: Secondary | ICD-10-CM

## 2023-05-22 DIAGNOSIS — M5030 Other cervical disc degeneration, unspecified cervical region: Secondary | ICD-10-CM

## 2023-05-22 DIAGNOSIS — R188 Other ascites: Secondary | ICD-10-CM

## 2023-05-22 DIAGNOSIS — Z8371 Family history of adenomatous and serrated polyps: Secondary | ICD-10-CM

## 2023-05-22 DIAGNOSIS — K219 Gastro-esophageal reflux disease without esophagitis: Secondary | ICD-10-CM

## 2023-05-22 DIAGNOSIS — M2550 Pain in unspecified joint: Secondary | ICD-10-CM

## 2023-05-22 DIAGNOSIS — M25571 Pain in right ankle and joints of right foot: Secondary | ICD-10-CM

## 2023-05-22 DIAGNOSIS — Z8719 Personal history of other diseases of the digestive system: Secondary | ICD-10-CM

## 2023-05-22 DIAGNOSIS — M5134 Other intervertebral disc degeneration, thoracic region: Secondary | ICD-10-CM

## 2023-05-22 DIAGNOSIS — M797 Fibromyalgia: Secondary | ICD-10-CM

## 2023-05-22 DIAGNOSIS — R42 Dizziness and giddiness: Secondary | ICD-10-CM

## 2023-05-22 DIAGNOSIS — M25572 Pain in left ankle and joints of left foot: Secondary | ICD-10-CM | POA: Insufficient documentation

## 2023-05-22 DIAGNOSIS — Z7729 Contact with and (suspected ) exposure to other hazardous substances: Secondary | ICD-10-CM

## 2023-05-22 DIAGNOSIS — M205X9 Other deformities of toe(s) (acquired), unspecified foot: Secondary | ICD-10-CM | POA: Insufficient documentation

## 2023-05-22 DIAGNOSIS — R195 Other fecal abnormalities: Secondary | ICD-10-CM

## 2023-05-22 DIAGNOSIS — K2950 Unspecified chronic gastritis without bleeding: Secondary | ICD-10-CM | POA: Insufficient documentation

## 2023-05-22 DIAGNOSIS — F4310 Post-traumatic stress disorder, unspecified: Secondary | ICD-10-CM

## 2023-05-22 DIAGNOSIS — R1084 Generalized abdominal pain: Secondary | ICD-10-CM

## 2023-05-22 DIAGNOSIS — B9681 Helicobacter pylori [H. pylori] as the cause of diseases classified elsewhere: Secondary | ICD-10-CM | POA: Insufficient documentation

## 2023-05-22 DIAGNOSIS — R1310 Dysphagia, unspecified: Secondary | ICD-10-CM | POA: Insufficient documentation

## 2023-05-22 DIAGNOSIS — G5603 Carpal tunnel syndrome, bilateral upper limbs: Secondary | ICD-10-CM

## 2023-05-22 DIAGNOSIS — M722 Plantar fascial fibromatosis: Secondary | ICD-10-CM

## 2023-05-22 DIAGNOSIS — R09A2 Foreign body sensation, throat: Secondary | ICD-10-CM

## 2023-05-22 DIAGNOSIS — N951 Menopausal and female climacteric states: Secondary | ICD-10-CM

## 2023-05-22 DIAGNOSIS — E559 Vitamin D deficiency, unspecified: Secondary | ICD-10-CM

## 2023-05-22 DIAGNOSIS — Z9049 Acquired absence of other specified parts of digestive tract: Secondary | ICD-10-CM

## 2023-05-22 DIAGNOSIS — K5730 Diverticulosis of large intestine without perforation or abscess without bleeding: Secondary | ICD-10-CM

## 2023-05-22 DIAGNOSIS — M79604 Pain in right leg: Secondary | ICD-10-CM | POA: Insufficient documentation

## 2023-05-22 NOTE — Patient Instructions (Addendum)
VISIT SUMMARY:  During today's visit, we discussed your ongoing medical conditions, including your pancreatic cyst, fibromyalgia, chronic low back pain with radiculopathy, mixed anxiety and depression disorder (PTSD), and gastroesophageal reflux disease (GERD). We reviewed your current medications and made some adjustments to better manage your symptoms. We also discussed the importance of regular monitoring and follow-up with your specialists.  YOUR PLAN:  -PANCREATIC CYST: A pancreatic cyst is a fluid-filled sac within the pancreas. We will closely monitor this with a CT scan with contrast to evaluate your ascites and consider an endoscopic ultrasound and biopsy. An MRCP may also be used for additional monitoring. A letter will be sent to your GI doctor regarding your fluctuating ascites, and you should follow up with the GI specialist in March.  -FIBROMYALGIA: Fibromyalgia is a condition characterized by chronic widespread pain. We discussed resuming Lyrica at 50 mg three times a day and starting amitriptyline to help manage your fibromyalgia and PTSD. We also talked about low dose naltrexone as an experimental option and considering Savella and Cymbalta if needed.  -CHRONIC LOW BACK PAIN WITH RADICULOPATHY: Chronic low back pain with radiculopathy involves nerve pain that radiates from the lower back into the legs. This is due to degenerative disc disease. You should avoid strenuous activities, use supportive devices, and continue follow-up with your spine specialist. Consider using a cane with a padded handle for support.  -MIXED ANXIETY AND DEPRESSION DISORDER (PTSD): PTSD with mixed anxiety and depression involves persistent mental and emotional stress. We discussed resuming sertraline (Zoloft) and starting amitriptyline to help manage both PTSD and fibromyalgia. Medication adherence is important for managing your symptoms.  -GASTROESOPHAGEAL REFLUX DISEASE (GERD): GERD is a condition where  stomach acid frequently flows back into the esophagus. Continue using Tums and Gaviscon as needed, and consider resuming Prilosec if your symptoms worsen.  -GENERAL HEALTH MAINTENANCE: Routine health maintenance includes regular follow-up and monitoring of your chronic conditions. 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 We will recheck your calcium and ALK phos levels every 6-12 months. Ensure regular follow-up with your primary care and specialists.  INSTRUCTIONS:  Follow up with the GI specialist in March. Schedule follow-up appointments as needed for your chronic conditions. Upload and review any new medical records or test results.  Regarding Lyrica (pregabalin) and ascites: While fluid retention is a known side effect of Lyrica, clinically significant ascites is not commonly reported  many patients take Lyrica long-term safely for fibromyalgia  Alternative treatments for fibromyalgia  Gentle exercise programs like tai chi, swimming, or walking Physical therapy Cognitive behavioral therapy Stress reduction techniques Acupuncture Sleep hygiene improvements  Other medication options: Cymbalta (duloxetine) Savella (milnacipran) Low-dose amitriptyline Gabapentin (similar to Lyrica but sometimes better tolerated)  The most effective approach is usually a combination of treatments rather than relying on a single medication.  Regarding MRI  and CT with discrepancy on whether ascites is present.  1. Relative Imaging Characteristics: - CT with contrast is generally considered the gold standard for detecting ascites - MRI also has excellent sensitivity for detecting fluid collections - Both modalities are highly sensitive for detecting even small amounts of ascites  2. Possible explanations for the discrepancy:  The most likely explanations are:  a) Timing difference: - Ascites can indeed fluctuate, especially if the patient:   - Started  diuretic therapy between scans   - Had paracentesis between scans   - Had changes in their underlying condition (improved liver  function, resolved heart failure, etc.)  b) Technical considerations: - MRI false positive is possible but unusual - The sequence timing of the MRI can sometimes make normal peritoneal fluid appear more prominent - Worth noting MRI stated: "significant" ascites  Critical question: What was the time interval between the two studies? This would help determine if fluctuation is a likely explanation.  Regarding CT with contrast sensitivity/specificity for ascites: - Sensitivity: >95% - Specificity: >95% - CT is actually considered more sensitive than MRI for detecting small amounts of ascites  Welcome aboard!   Today's visit was a valuable first step in understanding your health and starting your personalized care journey. We discussed your medical history and medications in detail. Given the extensive information, we prioritized addressing your most pressing concerns.  We understood those concerns to be:  New Patient (Initial Visit)   Building a Complete Picture  To create the most effective care plan possible, we may need additional information from previous providers. We encouraged you to gather any relevant medical records for your next visit. This will help [place] build a more complete picture and develop a personalized plan together. In the meantime, we'll address your immediate concerns and provide resources to help you manage all of your medical issues.  We encourage you to use MyChart to review these efforts, and to help [place] find and correct any omissions or errors in your medical chart.  Managing Your Health Over Time  Managing every aspect of your health in a single visit isn't always feasible, but that's okay.  We addressed your most pressing concerns today and charted a course for future care. Acute conditions or preventive care measures may require further  attention.  We encourage you to schedule a follow-up visit at your earliest convenience to discuss any unresolved issues.  We strongly encourage participation in annual preventive care visits to help [place] develop a more thorough understanding of your health and to help you maintain optimal wellness - please inquire about scheduling your next one with [place] at your earliest convenience.  Your Satisfaction Matters  It was a pleasure seeing you today!  Your health and satisfaction will always be my top priorities. If you believe your experience today was worthy of a 5-star rating, I'd be grateful for your feedback!  Lula Olszewski, MD   Next Steps  Schedule Follow-Up:  We recommend a follow-up appointment in No follow-ups on file. If your condition worsens before then, please call us or seek emergency care. Preventive Care:  Don't forget to schedule your annual preventive care visit!  This important checkup is typically covered by insurance and helps identify potential health issues early.  Typically its 100% insurance covered with no co-pay and helps to get surveillance labwork paid for through your insurance provider.  Sometimes it even lowers your insurance premiums to participate. Medical Information Release:  For any relevant medical information we don't have, please sign a release form so we can obtain it for your records. Lab & X-ray Appointments:  Scheduled any incomplete lab tests today or call us to schedule.  X-Rays can be done without an appointment at [facility] ([address], [address]), M-F 8:30am-noon or 1pm-5pm.  Just tell them you're there for X-rays ordered by Dr. Jon Billings.  We'll receive the results and contact you by phone or MyChart to discuss next steps.  Bring to Your Next Appointment  Medications: Please bring all your medication bottles to your next appointment to ensure we  have an accurate record of your prescriptions. Health Diaries: If you're monitoring  any health conditions at home, keeping a diary of your readings can be very helpful for discussions at your next appointment.  Reviewing Your Records  Please Review this early draft of your clinical notes below and the final encounter summary tomorrow on MyChart after its been completed.   Pancreatic cyst Assessment & Plan: If not satisfied with [facility] plan she will let me know and I will refer.   Degeneration of intervertebral disc of lumbar region with discogenic back pain and lower extremity pain  DDD (degenerative disc disease), cervical  DDD (degenerative disc disease), thoracic  PTSD (post-traumatic stress disorder)  Fibromyalgia  Vitamin D deficiency  Other chronic gastritis without hemorrhage  Postural lightheadedness  Plantar fascial fibromatosis  Pain in right leg  Multiple joint pain  Mixed anxiety and depressive disorder  Menopausal and female climacteric states  Laryngopharyngeal reflux (LPR)  Other iron deficiency anemia  History of diverticulitis  Heme positive stool  Helicobacter pylori (H. pylori) as the cause of diseases classified elsewhere  Hallux limitus, unspecified laterality  Globus pharyngeus  Generalized abdominal pain  Gastro-esophageal reflux disease without esophagitis  Family history of adenomatous polyp of colon  Exposure to potentially hazardous substance  Dysphagia, unspecified type  Diverticulosis of colon without diverticulitis  Bilateral carpal tunnel syndrome  Bilateral ankle joint pain  Other ascites  History of cholecystectomy     Getting Answers and Following Up  Simple Questions & Concerns: For quick questions or basic follow-up after your visit, reach [place] at [phone] or MyChart messaging. Complex Concerns: If your concern is more complex, scheduling an appointment might be best. Discuss this with the staff to find the most suitable option. Lab & Imaging Results: We'll  contact you directly if results are abnormal or you don't use MyChart. Most normal results will be on MyChart within 2-3 business days, with a review message from Dr. Jon Billings. Haven't heard back in 2 weeks? Need results sooner? Contact us at [phone]. Referrals: Our referral coordinator will manage specialist referrals. The specialist's office should contact you within 2 weeks to schedule an appointment. Call us if you haven't heard from them after 2 weeks.  Staying Connected  MyChart: Activate your MyChart for the fastest way to access results and message [place]. See the last page of this paperwork for instructions.  Billing  X-ray & Lab Orders: These are billed by separate companies. Contact the invoicing company directly for questions or concerns. Visit Charges: Discuss any billing inquiries with our administrative services team.  Feedback & Satisfaction  Share Your Experience: We strive for your satisfaction! If you have any complaints, please let Dr. Jon Billings know directly or contact our Practice Administrators, Edwena Felty or [employer], by asking at the front desk.  Scheduling Tips  Shorter Wait Times: 8 am and 1 pm appointments often have the quickest wait times. Longer Appointments: If you need more time during your visit, talk to the front desk. Due to insurance regulations, multiple back-to-back appointments might be necessary.

## 2023-05-22 NOTE — Assessment & Plan Note (Signed)
If not satisfied with [facility] plan she will let me know and I will refer.

## 2023-05-22 NOTE — Assessment & Plan Note (Signed)
Pancreatic Cyst A pancreatic cyst was identified on MRI, accompanied by fluctuating ascites. The MRI revealed a T2 hyperintense lesion in the right hepatic lobe, likely a small hemangioma, and a cystic lesion in the pancreatic head. Close monitoring is crucial due to potential severity. CT with contrast is the gold standard for evaluating ascites, and discrepancies between MRI and CT findings may result from timing differences. Risks of waiting a year for the next MRI were discussed, with alternative monitoring approaches suggested, including endoscopic ultrasound and MRCP.  She gets testing through [facility] so I provided support paperwork recommending additional evaluation for her due to ascites seen on MRI.Marland Kitchen Recommend consideration of endoscopic ultrasound and biopsy. Suggest MRCP as an additional monitoring tool. Write a letter to the GI doctor regarding fluctuating ascites. Follow up with the GI specialist in March.  Associated with ascites reversal with pain (ascites went away from MRI as pain started 04/2023: Spontaneous Internal Redistribution: The fluid might have shifted from the peritoneal cavity into another compartment This could explain both the apparent reduction in ascites and the pain Could involve movement into retroperitoneal space or pleural cavity   Third-Spacing with Inflammation: An acute inflammatory process could cause fluid to move into inflamed tissues This could cause the observed pain while reducing visible ascites The pain pattern (upper abdomen/back) might indicate where fluid redistributed   Portal Vein Events: Changes in portal pressure can affect fluid distribution A sudden change in portal flow could cause both symptoms The upper abdominal/back pain location fits with this  Possible Vascular Event: A vascular event affecting the splanchnic circulation Could explain both the pain and fluid redistribution Location of pain matches  vascular territories

## 2023-05-22 NOTE — Progress Notes (Unsigned)
[facility]  Phone: [phone]  - Medical Office Visit -  Visit Date: 05/22/2023 Patient: Martha [name]   DOB: 07-07-1965   58 y.o. Female  MRN: [id number] Patient Care Team: Lula Olszewski, MD as PCP - General (Internal Medicine) Jaymes Graff, MD as Consulting Physician (Obstetrics and Gynecology) Today's Health Care Provider: Lula Olszewski, MD  ===========================================   Assessment & Plan Pancreatic cyst Pancreatic Cyst A pancreatic cyst was identified on MRI, accompanied by fluctuating ascites. The MRI revealed a T2 hyperintense lesion in the right hepatic lobe, likely a small hemangioma, and a cystic lesion in the pancreatic head. Close monitoring is crucial due to potential severity. CT with contrast is the gold standard for evaluating ascites, and discrepancies between MRI and CT findings may result from timing differences. Risks of waiting a year for the next MRI were discussed, with alternative monitoring approaches suggested, including endoscopic ultrasound and MRCP.  She gets testing through [facility] so I provided support paperwork recommending additional evaluation for her due to ascites seen on MRI.Marland Kitchen Recommend consideration of endoscopic ultrasound and biopsy. Suggest MRCP as an additional monitoring tool. Write a letter to the GI doctor regarding fluctuating ascites. Follow up with the GI specialist in March.  Associated with ascites reversal with pain (ascites went away from MRI as pain started 04/2023: Spontaneous Internal Redistribution: The fluid might have shifted from the peritoneal cavity into another compartment This could explain both the apparent reduction in ascites and the pain Could involve movement into retroperitoneal space or pleural cavity   Third-Spacing with Inflammation: An acute inflammatory process could cause fluid to move into inflamed tissues This could cause the observed pain  while reducing visible ascites The pain pattern (upper abdomen/back) might indicate where fluid redistributed   Portal Vein Events: Changes in portal pressure can affect fluid distribution A sudden change in portal flow could cause both symptoms The upper abdominal/back pain location fits with this  Possible Vascular Event: A vascular event affecting the splanchnic circulation Could explain both the pain and fluid redistribution Location of pain matches vascular territories Degeneration of intervertebral disc of lumbar region with discogenic back pain and lower extremity pain Chronic Low Back Pain with Radiculopathy Chronic low back pain with radiculopathy in both legs is secondary to degenerative disc disease. Imaging shows disc desiccation and bulging at multiple levels with nerve impingement. There is a history of nerve ablations and injections for pain management. Avoid strenuous activities and use supportive devices. Continue follow-up with a spine specialist. Avoid activities that exacerbate pain. Consider using a cane with a padded handle for support. DDD (degenerative disc disease), cervical  DDD (degenerative disc disease), thoracic  PTSD (post-traumatic stress disorder)  Fibromyalgia Was on Lyrica, but holding due to ascites.  Fibromyalgia Chronic widespread pain is consistent with fibromyalgia. Lyrica was stopped due to concerns about ascites. The safety of resuming Lyrica was discussed, along with alternative treatments. Lyrica is unlikely to be the cause of ascites given the timing of symptoms. Potential benefits and risks of amitriptyline, low dose naltrexone, Savella, and Cymbalta were discussed. Resume Lyrica 50 mg three times a day. Start amitriptyline for fibromyalgia and PTSD. Discuss low dose naltrexone as an experimental option. Consider Savella and Cymbalta if needed. Vitamin D deficiency  Other chronic gastritis without hemorrhage  Postural  lightheadedness  Plantar fascial fibromatosis  Pain in right leg  Multiple joint pain  Mixed anxiety and depressive disorder Mixed Anxiety and Depression Disorder (PTSD) PTSD  with mixed anxiety and depression is present. Sertraline (Zoloft) is not currently taken but there are plans to resume. Medication adherence and the potential benefits of amitriptyline for both PTSD and fibromyalgia were discussed. Resume sertraline (Zoloft). Start amitriptyline for PTSD and fibromyalgia. Menopausal and female climacteric states  Laryngopharyngeal reflux (LPR)  Other iron deficiency anemia  History of diverticulitis  Heme positive stool  Helicobacter pylori (H. pylori) as the cause of diseases classified elsewhere  Hallux limitus, unspecified laterality  Globus pharyngeus  Generalized abdominal pain  Gastro-esophageal reflux disease without esophagitis Gastroesophageal Reflux Disease (GERD) GERD symptoms are managed by Tums and occasional use of Gaviscon. Prilosec or Voltaren is not currently taken. The potential need to resume Prilosec if symptoms worsen was discussed. Continue Tums and Gaviscon as needed. Consider resuming Prilosec if symptoms worsen. Family history of adenomatous polyp of colon  Exposure to potentially hazardous substance  Dysphagia, unspecified type  Diverticulosis of colon without diverticulitis  Bilateral carpal tunnel syndrome  Bilateral ankle joint pain  Other ascites  History of cholecystectomy   General Health Maintenance Routine health maintenance was discussed, including regular follow-up and monitoring of chronic conditions. Colonoscopy is up to date, and there is a history of benign breast disorder. Recheck calcium and ALK phos levels every 6-12 months. Ensure regular follow-up with primary care and specialists.  Follow-up Follow up with the GI specialist in March. Schedule follow-up appointments as needed for chronic conditions. Upload and  review any new medical records or test results.     Subjective  58 y.o. female who has Vitamin D deficiency; Multiple joint pain; CTS (carpal tunnel syndrome); Iron deficiency anemia; Heme positive stool; Bilateral ankle joint pain; Exposure to potentially hazardous substance; Family history of adenomatous polyp of colon; Hallux limitus; Diverticulosis of colon without diverticulitis; Dysphagia; Gastro-esophageal reflux disease without esophagitis; Generalized abdominal pain; Globus pharyngeus; Helicobacter pylori (H. pylori) as the cause of diseases classified elsewhere; History of diverticulitis; Laryngopharyngeal reflux (LPR); Menopausal and female climacteric states; Mixed anxiety and depressive disorder; Pain in right leg; Plantar fascial fibromatosis; Postural lightheadedness; Unspecified chronic gastritis without bleeding; Other ascites; Pancreatic cyst; and History of cholecystectomy on their problem list. Her reasons/main concerns/chief complaints for today's office visit are New Patient (Initial Visit)   ----------------------------------------------------------------------------------------------------------------- AI-Extracted: Discussed the use of AI scribe software for clinical note transcription with the patient, who gave verbal consent to proceed. History of Present Illness The patient, a former smoker and postmenopausal individual, presents with a history of multiple medical conditions including fibromyalgia, mixed anxiety and depression disorder, dysphagia, and a history of Helicobacter pylori infection. The patient also reports a history of exposure to hazardous substances during the [place] War.  The patient continues to experience persistent foot pain and general abdominal discomfort, which they describe as a sensation of something caught in their throat. [facility]. The patient also reports pain in both legs and experiences  lightheadedness upon standing up.  The patient has a history of a back injury sustained during [employer], which has resulted in radiculopathy in both legs. They report chronic, widespread pain, which they attribute to fibromyalgia. The patient also reports a history of sinusitis and PTSD.  The patient has been prescribed multiple medications including Prilosec, B complex vitamins, Tums, iron supplements, multivitamins, Aleve, and sertraline (Zoloft), which they plan to resume taking. They also report taking vitamin D, vitamin K, and Lyrica (pregabalin). The patient has been prescribed Gaviscon, diclofenac, Nexium, and Flonase,  which they confirm they are taking.  The patient has a history of a breast disorder and diverticulosis. They have undergone a colonoscopy and are up-to-date with this procedure. They deny having a breast lump.  The patient has a history of a back injury, which has resulted in chronic pain and has been managed with nerve ablations and injections. They report that even simple household tasks such as sweeping or mopping can exacerbate their back pain.  The patient has a history of a pancreatic cyst and ascites, which have been monitored with regular MRIs. They report fluctuating ascites, which they believe may be related to their pancreatic cyst. The patient also reports having their gallbladder removed in the past.  The patient has a master's degree, is not homeless, and reports having adequate family and social support. They deny any cognitive impairments or learning disabilities. They report feeling safe in their current relationship and deny any history of abuse.   Past Medical History - Foot pain - Diverticulosis - Dysphagia - GERD - Helicobacter pylori - Mixed anxiety and depression disorder - Fibromyalgia - Pain - Back injury - Radiculopathy - Sinusitis - Pancreatic cyst -  Ascites  ----------------------------------------------------------------------------------------------------------------- Problem list overviews that were updated at today's visit: Problem  Gastro-Esophageal Reflux Disease Without Esophagitis  Mixed Anxiety and Depressive Disorder  Pancreatic Cyst   04/2023 MRI report: Pancreas: No main pancreatic ductal dilation. Lesion in the pancreatic head measuring 5 mm on axial images (series 601, image 21) and 8 mm on MRCP images (series 901, image 23) and 3 mm cystic lesion in the pancreatic tail (series 601, image 29) appear unchanged from the previous exam. It is uncertain whether these lesions are connected to the main pancreatic duct. No overt main pancreatic ductal dilation. Plan follow up in 1 year.  Following with [facility] gastrointestinal specialist.     Medications reviewed and modified: Medications - Prilosec - Voltaren - B complex - Tums - Iron - Multivitamin - Aleve - Sertraline - Zoloft - Vitamin D - Vitamin K - Lyrica - Pregabalin - Gaviscon - Diclofenac - Nexium - Flonase Current Outpatient Medications on File Prior to Visit  Medication Sig   pregabalin (LYRICA) 50 MG capsule Take 50 mg by mouth 3 (three) times daily.   B Complex Vitamins (VITAMIN B COMPLEX PO) Take by mouth daily.   calcium carbonate (TUMS - DOSED IN MG ELEMENTAL CALCIUM) 500 MG chewable tablet Chew 2 tablets by mouth daily as needed for indigestion or heartburn.   Ferrous Sulfate (IRON) 325 (65 Fe) MG TABS Take 325 mg by mouth 3 (three) times a week.    Multiple Vitamin (MULTIVITAMIN WITH MINERALS) TABS tablet Take 1 tablet by mouth daily. Reported on 08/22/2015   naproxen sodium (ALEVE) 220 MG tablet Take 660 mg by mouth daily as needed (mild pain).   sertraline (ZOLOFT) 25 MG tablet Take 25 mg by mouth daily.   VITAMIN D PO Take by mouth daily.   VITAMIN K PO Take by mouth daily.   No current facility-administered  medications on file prior to visit.   Medications Discontinued During This Encounter  Medication Reason   omeprazole (PRILOSEC) 20 MG capsule    diclofenac Sodium (VOLTAREN) 1 % GEL     Problems: has Vitamin D deficiency; Multiple joint pain; CTS (carpal tunnel syndrome); Iron deficiency anemia; Heme positive stool; Bilateral ankle joint pain; Exposure to potentially hazardous substance; Family history of adenomatous polyp of colon; Hallux limitus; Diverticulosis of colon without  diverticulitis; Dysphagia; Gastro-esophageal reflux disease without esophagitis; Generalized abdominal pain; Globus pharyngeus; Helicobacter pylori (H. pylori) as the cause of diseases classified elsewhere; History of diverticulitis; Laryngopharyngeal reflux (LPR); Menopausal and female climacteric states; Mixed anxiety and depressive disorder; Pain in right leg; Plantar fascial fibromatosis; Postural lightheadedness; Unspecified chronic gastritis without bleeding; Other ascites; Pancreatic cyst; and History of cholecystectomy on their problem list. Current Meds  Medication Sig   pregabalin (LYRICA) 50 MG capsule Take 50 mg by mouth 3 (three) times daily.   Allergies:   Allergies  Allergen Reactions   Blue Dyes (Parenteral)    Past Medical History:  has a past medical history of Arthritis, GERD (gastroesophageal reflux disease), PTSD (post-traumatic stress disorder), Tubular adenoma of colon, Ulcer (05/05/2010), and Vitamin D deficiency. Past Surgical History:   has a past surgical history that includes Esophagogastroduodenoscopy (05/05/2010); Tonsillectomy; Deep neck lymph node biopsy / excision; Foot surgery; and Colonoscopy. Social History:   reports that she quit smoking about 33 years ago. She has never used smokeless tobacco. She reports current alcohol use of about 1.0 standard drink of alcohol per week. She reports that she does not use drugs. Family History:  family history includes Anemia in her mother; Asthma in  her sister; Gout in her paternal grandfather; Healthy in her daughter, sister, sister, and son; Hyperlipidemia in her father, mother, and sister; Hypertension in her father and sister; Liver disease in her paternal grandfather; Multiple sclerosis in her maternal grandfather. Depression Screen and Health Maintenance:    05/22/2023   10:02 AM 06/09/2017    8:13 AM 05/22/2017   10:54 AM 08/22/2015   11:00 AM  PHQ 2/9 Scores  PHQ - 2 Score 0 2 4 0  PHQ- 9 Score 2 10 12     Health Maintenance  Topic Date Due   HIV Screening  Never done   Hepatitis C Screening  Never done   Zoster Vaccines- Shingrix (1 of 2) Never done   Cervical Cancer Screening (HPV/Pap Cotest)  04/08/2015   MAMMOGRAM  03/27/2017   Colonoscopy  04/27/2019   INFLUENZA VACCINE  12/04/2022   COVID-19 Vaccine (7 - 2024-25 season) 01/04/2023   DTaP/Tdap/Td (3 - Td or Tdap) 08/21/2025   HPV VACCINES  Aged Out   Immunization History  Administered Date(s) Administered   PFIZER(Purple Top)SARS-COV-2 Vaccination 07/14/2019, 01/02/2020, 01/23/2020   PPD Test 08/22/2015   Td 08/22/2015   Tdap 03/27/2006     Objective   Physical ExamBP 121/75 (BP Location: Left Arm)   Pulse 65   Temp 97.6 F (36.4 C)   Ht 5\' 5"  (1.651 m)   Wt 180 lb (81.6 kg)   LMP 01/03/2022 (Approximate)   SpO2 98%   BMI 29.95 kg/m  Wt Readings from Last 10 Encounters:  05/22/23 180 lb (81.6 kg)  07/27/20 194 lb 6.4 oz (88.2 kg)  06/29/20 193 lb (87.5 kg)  01/21/20 191 lb (86.6 kg)  05/11/18 194 lb (88 kg)  06/09/17 188 lb (85.3 kg)  05/22/17 192 lb 6.4 oz (87.3 kg)  08/22/15 181 lb 12.8 oz (82.5 kg)  05/07/15 180 lb (81.6 kg)  10/16/14 183 lb 6.4 oz (83.2 kg)  Vital signs reviewed.  Nursing notes reviewed. Weight trend reviewed. Abnormalities and problem-specific physical exam findings:  mild truncal adiposity  General Appearance:  Well developed, well nourished, well-groomed, healthy-appearing female with Body mass index is 29.95 kg/m. No acute  distress appreciable.   Skin: Clear and well-hydrated. Pulmonary:  Normal work of breathing at rest,  no respiratory distress apparent. SpO2: 98 %  Musculoskeletal: She demonstrates smooth and coordinated movements throughout all major joints.All extremities are intact.  Neurological:  Awake, alert, oriented, and engaged.  No obvious focal neurological deficits or cognitive impairments.  Sensorium seems unclouded.  Psychiatric:  Appropriate mood, pleasant and cooperative demeanor, cheerful and engaged during the exam  Reviewed Results & Data RADIOLOGY MRI Abdomen: T2 hyperintense lesion in right hepatic lobe, probably a small hemangioma; pancreatic head lesion unchanged; gallbladder removed (01/17/2022) MRI Abdomen: Pancreatic head lesion unchanged; ascites (04/07/2023) CT Abdomen: No ascites (04/13/2023) MRCP: Stable pancreatic lesions; no mention of ascites (2023) Lumbar Spine MRI: Disc desiccation and mild narrowing at L2-L3, L4-L5; bulges at L2-L3, L3-L4, L4-L5; bilateral nerve impingement; mild spinal cord compression (07/2022)       No results found for any visits on 05/22/23.  No visits with results within 1 Year(s) from this visit.  Latest known visit with results is:  Admission on 01/14/2021, Discharged on 01/15/2021  Component Date Value   Sodium 01/14/2021 137    Potassium 01/14/2021 3.6    Chloride 01/14/2021 104    CO2 01/14/2021 25    Glucose, Bld 01/14/2021 91    BUN 01/14/2021 8    Creatinine, Ser 01/14/2021 0.63    Calcium 01/14/2021 9.1    Total Protein 01/14/2021 7.3    Albumin 01/14/2021 3.7    AST 01/14/2021 17    ALT 01/14/2021 14    Alkaline Phosphatase 01/14/2021 94    Total Bilirubin 01/14/2021 0.6    GFR, Estimated 01/14/2021 >60    Anion gap 01/14/2021 8    WBC 01/14/2021 6.6    RBC 01/14/2021 4.92    Hemoglobin 01/14/2021 12.7    HCT 01/14/2021 41.2    MCV 01/14/2021 83.7    MCH 01/14/2021 25.8 (L)    MCHC 01/14/2021 30.8    RDW 01/14/2021 13.6     Platelets 01/14/2021 318    nRBC 01/14/2021 0.0    Neutrophils Relative % 01/14/2021 46    Neutro Abs 01/14/2021 3.0    Lymphocytes Relative 01/14/2021 43    Lymphs Abs 01/14/2021 2.9    Monocytes Relative 01/14/2021 8    Monocytes Absolute 01/14/2021 0.6    Eosinophils Relative 01/14/2021 2    Eosinophils Absolute 01/14/2021 0.2    Basophils Relative 01/14/2021 1    Basophils Absolute 01/14/2021 0.0    Immature Granulocytes 01/14/2021 0    Abs Immature Granulocytes 01/14/2021 0.02    No image results found.   No results found.  MR LUMBAR SPINE WO CONTRAST Result Date: 07/10/2022 CLINICAL DATA:  Scoliosis, tingling in left arm and leg since 1988. EXAM: MRI LUMBAR SPINE WITHOUT CONTRAST TECHNIQUE: Multiplanar, multisequence MR imaging of the lumbar spine was performed. No intravenous contrast was administered. COMPARISON:  Thoracic and lumbar spine radiographs 05/22/2017 FINDINGS: Segmentation: There are 5 non-rib-bearing lumbar-type vertebral bodies. The lowest formed disc space is designated L5-S1. Alignment: There is mild levocurvature centered at L2. There is no antero or retrolisthesis. Vertebrae: Vertebral body heights are preserved. Background marrow signal is normal. There is no suspicious marrow signal abnormality or marrow edema. Conus medullaris and cauda equina: Conus extends to the L1-L2 level. Conus and cauda equina appear normal. Paraspinal and other soft tissues: Unremarkable. Disc levels: There is disc desiccation and mild narrowing at L2-L3 through L4-L5, most notable at L4-L5. T12-L1: Minimal facet arthropathy without significant spinal canal or neural foraminal stenosis L1-L2: Minimal facet arthropathy without significant spinal canal or neural  foraminal stenosis L2-L3: There is a mild disc bulge and mild bilateral facet arthropathy without significant spinal canal or neural foraminal stenosis. L3-L4: There is a mild disc bulge and mild to moderate facet arthropathy without  significant spinal canal or neural foraminal stenosis L4-L5: There is a disc bulge with a superimposed central protrusion and mild-to-moderate bilateral facet arthropathy with a small right effusion resulting in mild spinal canal stenosis with left worse than right subarticular zone narrowing and possible irritation of the traversing left L5 nerve root, and no significant neural foraminal stenosis L5-S1: Mild-to-moderate bilateral facet arthropathy without significant spinal canal or neural foraminal stenosis. IMPRESSION: 1. Mild levocurvature centered at L2. 2. Disc bulge with a superimposed central protrusion and mild-to-moderate facet arthropathy at L4-L5 resulting in mild spinal canal stenosis with left worse than right subarticular zone narrowing and possible irritation of the traversing left L5 nerve root. 3. Mild-to-moderate facet arthropathy at L3-L4 and L5-S1 without significant spinal canal or neural foraminal stenosis. Electronically Signed   By: Lesia Hausen M.D.   On: 07/10/2022 14:11          Additional notes: Initial Appointment Goals:  This initial visit focused on establishing a foundation for the patient's care. We collaboratively reviewed her medical history and medications in detail, updating the chart as shown in the encounter. Given the extensive information, we prioritized addressing her most pressing concerns, which she reported were: New Patient (Initial Visit)  While the complexity of the patient's medical picture may necessitate further evaluation in subsequent visits, we were able to develop a preliminary care plan together. To expedite a comprehensive plan at the next visit, we encouraged the patient to gather relevant medical records from previous providers. This collaborative approach will ensure a more complete understanding of the patient's health and inform the development of a personalized care plan. We look forward to continuing the conversation and working together with  the patient on achieving her health goals.   Collaborative Documentation:  Today's encounter utilized real-time, dynamic patient engagement.  Patients actively participate by directly reviewing and assisting in updating their medical records through a shared screen. This transparency empowers patients to visually confirm chart updates made by the healthcare provider.  This collaborative approach facilitates problem management as we jointly update the problem list, problem overview, and assessment/plan. Ultimately, this process enhances chart accuracy and completeness, fostering shared decision-making, patient education, and informed consent for tests and treatments.  Collaborative Treatment Planning:  Treatment plans were discussed and reviewed in detail.  Explained medication safety and potential side effects.  Encouraged participation and answered all patient questions, confirming understanding and comfort with the plan. Encouraged patient to contact our office if they have any questions or concerns. Agreed on patient returning to office if symptoms worsen, persist, or new symptoms develop. Discussed precautions in case of needing to visit the Emergency Department.  ----------------------------------------------------- Lula Olszewski, MD  05/23/2023 11:50 AM  [facility]:  [phone]

## 2023-05-23 ENCOUNTER — Encounter: Payer: Self-pay | Admitting: Internal Medicine

## 2023-05-23 NOTE — Assessment & Plan Note (Signed)
Mixed Anxiety and Depression Disorder (PTSD) PTSD with mixed anxiety and depression is present. Sertraline (Zoloft) is not currently taken but there are plans to resume. Medication adherence and the potential benefits of amitriptyline for both PTSD and fibromyalgia were discussed. Resume sertraline (Zoloft). Start amitriptyline for PTSD and fibromyalgia.

## 2023-05-23 NOTE — Assessment & Plan Note (Signed)
Gastroesophageal Reflux Disease (GERD) GERD symptoms are managed by Tums and occasional use of Gaviscon. Prilosec or Voltaren is not currently taken. The potential need to resume Prilosec if symptoms worsen was discussed. Continue Tums and Gaviscon as needed. Consider resuming Prilosec if symptoms worsen.

## 2023-07-09 ENCOUNTER — Encounter: Payer: Self-pay | Admitting: Dietician

## 2023-07-09 ENCOUNTER — Encounter: Payer: 59 | Attending: Internal Medicine | Admitting: Dietician

## 2023-07-09 DIAGNOSIS — K219 Gastro-esophageal reflux disease without esophagitis: Secondary | ICD-10-CM | POA: Diagnosis present

## 2023-07-09 NOTE — Progress Notes (Signed)
 Medical Nutrition Therapy  Appointment Start time:  1112  Appointment End time:  1218  Primary concerns today: request a referral for stomach problems; liver and pancreas    Referral diagnosis: K21.9 Gastro-esophageal reflux disease without esophagitis Preferred learning style: no preference indicated (auditory, visual, hands on, no preference indicated) Learning readiness: preparation (not ready, contemplating, ready, change in progress)  NUTRITION ASSESSMENT   Anthropometrics Height: 65 in  Weight: 178.4  Clinical Medical Hx: gall bladder removal,  Medications: vit K, vit D, Iron, tylenol, antacids, proenzymes, multivitamins, pantoprazole, Lyrica  Labs: cholesterol 205; LDL 127.0; ALT <7 Notable Signs/Symptoms: none noted  Lifestyle & Dietary Hx  Pt states she has to do a yearly MRI on her pancreas, stating she has cysts on her pancreas. Pt states cabbage will cause reflux. Pt states apples used to give her problems, stating no issues with apples lately. Pt states she hasn't had wine in about 6 months, stating she used to have a glass once in a week or two.  Estimated daily fluid intake: 96 oz (alkaline water) Supplements: vit k, vit D, iron, multivitamin Sleep: 6 hours a night Stress / self-care: 5 on a scale of 1-10; meditation Current average weekly physical activity: ADLs; some walking and stretching without any consistency.   24-Hr Dietary Recall First Meal: coffee, banana, rice cake, water Snack: peanuts, with fruit Second Meal: salad (with cheese, cranberries, croutons, lemon, broccoli, lettuce, spinach) Snack: skinny pop popcorn, fruit Third Meal: vegan pasta with cashew cheese sauce Snack: chicken wings and meatballs Beverages: coffee (1.5 cups per day), alkaline water, 100% juice   NUTRITION DIAGNOSIS  NB-1.1 Food and nutrition-related knowledge deficit As related to GERD.  As evidenced by first visit of this type before.  NUTRITION INTERVENTION  Nutrition  education (E-1) on the following topics:   Lifestyle modifications can significantly improve GERD symptoms  Dietary Changes:  Avoid trigger foods, such as spicy, fatty, acidic, or chocolate foods. Eat smaller, more frequent meals instead of large ones. Chew food thoroughly. Avoid drinking alcohol and caffeine, especially before bed. Limit consumption of processed foods, sugary drinks, and refined carbohydrates.   Maintain a healthy weight or lose weight if overweight or obese.  Excess weight puts pressure on the stomach and can worsen reflux.   Avoid lying down within 2-3 hours of eating.  Elevate the head of your bed by 6-8 inches.  Avoid bending over or wearing tight clothing that puts pressure on the stomach.  Engage in regular physical activity, but avoid strenuous activities immediately after eating.   Avoid eating before bed.   Gallbladder Nutrition Therapy: Eat small, frequent meals and snacks. Limit fat to less than 30% of your total daily calories. Ask your registered dietitian what your goal should be. Choose foods that are low in fat. Read labels and look for foods that have 3 grams of fat or less per serving.  Handouts Provided Include  Types of Fat MyPlate Gallbladder Nutrition Therapy GERD Nutrition Therapy  Learning Style & Readiness for Change Teaching method utilized: Visual & Auditory  Demonstrated degree of understanding via: Teach Back  Barriers to learning/adherence to lifestyle change: nothing identified  Goals Established by Pt Small frequent meals; eat to satisfaction before fullness (do not get to fullness) Decrease fat in meals and snack; use mostly unsaturated fats when using fats; avoid fried foods and high saturated fat foods.   MONITORING & EVALUATION Dietary intake, weekly physical activity.  Next Steps  Patient is to follow up in  2-3 months.

## 2023-09-14 ENCOUNTER — Ambulatory Visit: Admitting: Dietician

## 2023-09-21 ENCOUNTER — Encounter: Admitting: Dietician

## 2023-09-21 ENCOUNTER — Encounter: Payer: Self-pay | Admitting: Dietician

## 2023-09-21 VITALS — Ht 65.0 in | Wt 176.9 lb

## 2023-09-21 DIAGNOSIS — K219 Gastro-esophageal reflux disease without esophagitis: Secondary | ICD-10-CM | POA: Diagnosis present

## 2023-09-21 NOTE — Progress Notes (Signed)
 Medical Nutrition Therapy Follow-up  Appointment Start time:  [phone]  Appointment End time: 0930  Primary concerns today: request a referral for stomach problems; liver and pancreas    Referral diagnosis: K21.9 Gastro-esophageal reflux disease without esophagitis Preferred learning style: no preference indicated (auditory, visual, hands on, no preference indicated) Learning readiness: preparation (not ready, contemplating, ready, change in progress)  NUTRITION ASSESSMENT   Anthropometrics Start weight: 178.4 Height: 65 in  Weight today: 176.9 lb  Clinical Medical Hx: gall bladder removal,  Medications: vit K, vit D, Iron, tylenol , antacids, proenzymes, multivitamins, pantoprazole, Lyrica  Labs: cholesterol 205; LDL 127.0; ALT <7 Notable Signs/Symptoms: none noted  Lifestyle & Dietary Hx  Pt states she cut back on eating large meals, stating she is small frequent meal. Pt states had and issue with cheese, stating it did not agree with her. Pt states it may have been due to the fact that she had mac and cheese a couple of days in a row. Pt states she has not increased any physical activity, stating she may get on her stepper. 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 Pt states she had a procedure done on her feet around Easter, stating she had some ingrown toenails. Pt states she avoided cabbage, stating it causes GI issues. Pt states when she first had her gall bladder removed she could not eat yogurt, stating now she can tolerate it. Pt states she has not been taking supplements consistently. Pt states she is seeing an Dentist, stating she is seeing him on Wednesday. Pt states it is a follow-up from a biometric scan. Pt states she is taking pro digest and flora digest recommended by the herbalist. Pt states it is like a digestive enzyme.  Estimated daily fluid intake: 96 oz  (alkaline water) Supplements: vit k, vit D, iron, multivitamin Sleep: 6 hours a night Stress / self-care: 5 on a scale of 1-10; meditation Current average weekly physical activity: ADLs; some walking and stretching without any consistency.   24-Hr Dietary Recall First Meal: coffee, banana, rice cake, water Snack: peanuts, with fruit Second Meal: salad (with cheese, cranberries, croutons, lemon, broccoli, lettuce, spinach) Snack: skinny pop popcorn, fruit Third Meal: vegan pasta with cashew cheese sauce Snack: chicken wings and meatballs Beverages: coffee (1.5 cups per day), alkaline water, 100% juice   NUTRITION DIAGNOSIS  NB-1.1 Food and nutrition-related knowledge deficit As related to GERD.  As evidenced by first visit of this type before.  NUTRITION INTERVENTION  Nutrition education (E-1) on the following topics:  MyPlate: Fruits & Vegetables: Aim to fill half your plate with a variety of fruits and vegetables. They are rich in vitamins, minerals, and fiber, and can help reduce the risk of chronic diseases. Choose a colorful assortment of fruits and vegetables to ensure you get a wide range of nutrients. Grains and Starches: Make at least half of your grain choices whole grains, such as Blonnie Maske rice, whole wheat bread, and oats. Whole grains provide fiber, which aids in digestion and healthy cholesterol levels. Aim for whole forms of starchy vegetables such as potatoes, sweet potatoes, beans, peas, and corn, which are fiber rich and provide many vitamins and minerals.  Protein: Incorporate lean sources of protein, such as poultry, fish, beans, nuts, and seeds, into your meals. Protein is essential for building and repairing tissues, staying full, balancing blood sugar, as well as  supporting immune function. Physical Activity: Aim for 150 minutes of physical activity weekly. Regular physical activity promotes overall health-including helping to reduce risk for heart disease and diabetes,  promoting mental health, and helping us  sleep better.  Physical activity offers a wide range of health benefits for people of all ages: Mental and Cognitive Benefits Reduces symptoms of depression and anxiety Improves mood and emotional well-being Enhances brain function and cognitive performance Reduces risk of dementia and cognitive decline with age Cardiovascular and Metabolic Health Lowers blood pressure and improves cholesterol levels Reduces risk of heart disease and stroke Improves circulation and heart function Helps regulate blood sugar and lowers risk of type 2 diabetes Musculoskeletal Health Strengthens bones and muscles Improves balance and coordination, reducing fall risk in older adults Supports joint health and reduces symptoms of arthritis Weight and Body Composition Helps maintain a healthy weight Reduces body fat and builds lean muscle Boosts metabolism and energy expenditure Sleep and Recovery Improves sleep quality and duration Reduces insomnia and sleep disturbances Immune and Longevity Benefits Boosts immune system function Lowers risk of certain cancers (e.g., breast, colon) Increases life expectancy and reduces all-cause mortality  Handouts Provided Include  Health Benefits of Physical Activity  Learning Style & Readiness for Change Teaching method utilized: Visual & Auditory  Demonstrated degree of understanding via: Teach Back  Barriers to learning/adherence to lifestyle change: nothing identified  Goals Established by Pt Continue: Small frequent meals; eat to satisfaction before fullness (do not get to fullness) Continue: Decrease fat in meals and snack; use mostly unsaturated fats when using fats New: increase physical activity when feet are better and your days are freed up New: increase non-starchy vegetables  MONITORING & EVALUATION Dietary intake, weekly physical activity.  Next Steps  Patient will call if she wants to follow-up.

## 2023-11-13 ENCOUNTER — Encounter: Payer: Self-pay | Admitting: Nurse Practitioner

## 2023-12-15 ENCOUNTER — Encounter: Payer: Self-pay | Admitting: Internal Medicine

## 2023-12-25 ENCOUNTER — Encounter: Payer: Self-pay | Admitting: Internal Medicine

## 2023-12-29 ENCOUNTER — Other Ambulatory Visit: Payer: Self-pay | Admitting: Internal Medicine

## 2023-12-29 DIAGNOSIS — N644 Mastodynia: Secondary | ICD-10-CM

## 2024-01-06 NOTE — Progress Notes (Unsigned)
 01/06/2024 Jade Burkard [id number] [birth year]   CHIEF COMPLAINT:   HISTORY OF PRESENT ILLNESS: Gwyneth Fernandez is a 58 year old female with a past medical history of arthritis, H. Pylori, GERD and colon polyps. She presents to our office today as  [facility]  [facility]GD 05/30/2014 by Dr. Albertus: 1. The mucosa of the esophagus appeared normal 2. The mucosa of the stomach appeared normal; multiple biopsies 3. The duodenal mucosa showed no abnormalities in the bulb and 2nd part of the duodenum cold forceps biopsies were taken in the bulb and second portion  Colonoscopy 04/26/2014: 1. The examined terminal ileum appeared to be normal  2. Two sessile polyps were found in the sigmoid colon; polypectomies were performed with a cold snare  3. Mild diverticulosis was noted in the ascending colon and sigmoid colon  Surgical [P], sigmoid, polyp (2) - TUBULAR ADENOMA. - HYPERPLASTIC POLYP - HIGH GRADE DYSPLASIA IS NOT IDENTIFIED  Past Medical History:  Diagnosis Date   Arthritis    GERD (gastroesophageal reflux disease)    PTSD (post-traumatic stress disorder)    Tubular adenoma of colon    Ulcer 05/05/2010   Peptic Ulcer/ H. Pylori +   Vitamin D  deficiency    Past Surgical History:  Procedure Laterality Date   COLONOSCOPY     DEEP NECK LYMPH NODE BIOPSY / EXCISION     ESOPHAGOGASTRODUODENOSCOPY  05/05/2010   +ulcer; +h. Pylori   FOOT SURGERY     TONSILLECTOMY     Social History:  Family History:    reports that she quit smoking about 34 years ago. She has never used smokeless tobacco. She reports current alcohol use of about 1.0 standard drink of alcohol per week. She reports that she does not use drugs.  family history includes Anemia in her mother; Asthma in her sister; Gout in her paternal grandfather; Healthy in her daughter, sister, sister, and son; Hyperlipidemia in her father, mother, and sister; Hypertension in her father and sister; Liver disease in her paternal grandfather;  Multiple sclerosis in her maternal grandfather. Allergies  Allergen Reactions   Blue Dyes (Parenteral)       Outpatient Encounter Medications as of 01/07/2024  Medication Sig   B Complex Vitamins (VITAMIN B COMPLEX PO) Take by mouth daily.   calcium carbonate (TUMS - DOSED IN MG ELEMENTAL CALCIUM) 500 MG chewable tablet Chew 2 tablets by mouth daily as needed for indigestion or heartburn.   Ferrous Sulfate  (IRON) 325 (65 Fe) MG TABS Take 325 mg by mouth 3 (three) times a week.    Multiple Vitamin (MULTIVITAMIN WITH MINERALS) TABS tablet Take 1 tablet by mouth daily. Reported on 08/22/2015   naproxen  sodium (ALEVE ) 220 MG tablet Take 660 mg by mouth daily as needed (mild pain).   pregabalin (LYRICA) 50 MG capsule Take 50 mg by mouth 3 (three) times daily.   sertraline (ZOLOFT) 25 MG tablet Take 25 mg by mouth daily.   VITAMIN D  PO Take by mouth daily.   VITAMIN K PO Take by mouth daily.   No facility-administered encounter medications on file as of 01/07/2024.   REVIEW OF SYSTEMS:  Gen: Denies fever, sweats or chills. No weight loss.  CV: Denies chest pain, palpitations or edema. Resp: Denies cough, shortness of breath of hemoptysis.  GI: Denies heartburn, dysphagia, stomach or lower abdominal pain. No diarrhea or constipation.  GU: Denies urinary burning, blood in urine, increased urinary frequency or incontinence. MS: Denies joint pain, muscles aches or  weakness. Derm: Denies rash, itchiness, skin lesions or unhealing ulcers. Psych: Denies depression, anxiety, memory loss or confusion. Heme: Denies bruising, easy bleeding. Neuro:  Denies headaches, dizziness or paresthesias. Endo:  Denies any problems with DM, thyroid or adrenal function.  PHYSICAL EXAM: There were no vitals taken for this visit. General: in no acute distress. Head: Normocephalic and atraumatic. Eyes:  Sclerae non-icteric, conjunctive pink. Ears: Normal auditory acuity. Mouth: Dentition intact. No ulcers or  lesions.  Neck: Supple, no lymphadenopathy or thyromegaly.  Lungs: Clear bilaterally to auscultation without wheezes, crackles or rhonchi. Heart: Regular rate and rhythm. No murmur, rub or gallop appreciated.  Abdomen: Soft, nontender, nondistended. No masses. No hepatosplenomegaly. Normoactive bowel sounds x 4 quadrants.  Rectal: Deferred.  Musculoskeletal: Symmetrical with no gross deformities. Skin: Warm and dry. No rash or lesions on visible extremities. Extremities: No edema. Neurological: Alert oriented x 4, no focal deficits.  Psychological: Alert and cooperative. Normal mood and affect.  ASSESSMENT AND PLAN:    CC:  Jesus Bernardino MATSU, MD

## 2024-01-07 ENCOUNTER — Ambulatory Visit: Payer: Self-pay | Admitting: Nurse Practitioner

## 2024-01-07 ENCOUNTER — Ambulatory Visit (INDEPENDENT_AMBULATORY_CARE_PROVIDER_SITE_OTHER): Admitting: Nurse Practitioner

## 2024-01-07 ENCOUNTER — Encounter: Payer: Self-pay | Admitting: Nurse Practitioner

## 2024-01-07 ENCOUNTER — Other Ambulatory Visit (INDEPENDENT_AMBULATORY_CARE_PROVIDER_SITE_OTHER)

## 2024-01-07 VITALS — BP 122/72 | HR 63 | Ht 65.0 in | Wt 179.0 lb

## 2024-01-07 DIAGNOSIS — R140 Abdominal distension (gaseous): Secondary | ICD-10-CM | POA: Diagnosis not present

## 2024-01-07 DIAGNOSIS — K5909 Other constipation: Secondary | ICD-10-CM

## 2024-01-07 DIAGNOSIS — Z860101 Personal history of adenomatous and serrated colon polyps: Secondary | ICD-10-CM | POA: Diagnosis not present

## 2024-01-07 DIAGNOSIS — K5900 Constipation, unspecified: Secondary | ICD-10-CM

## 2024-01-07 DIAGNOSIS — K219 Gastro-esophageal reflux disease without esophagitis: Secondary | ICD-10-CM | POA: Diagnosis not present

## 2024-01-07 LAB — COMPREHENSIVE METABOLIC PANEL WITH GFR
ALT: 8 U/L (ref 0–35)
AST: 13 U/L (ref 0–37)
Albumin: 4.3 g/dL (ref 3.5–5.2)
Alkaline Phosphatase: 90 U/L (ref 39–117)
BUN: 15 mg/dL (ref 6–23)
CO2: 30 meq/L (ref 19–32)
Calcium: 9.4 mg/dL (ref 8.4–10.5)
Chloride: 103 meq/L (ref 96–112)
Creatinine, Ser: 0.7 mg/dL (ref 0.40–1.20)
GFR: 96.44 mL/min (ref >60.00)
Glucose, Bld: 86 mg/dL (ref 70–99)
Potassium: 4.2 meq/L (ref 3.5–5.1)
Sodium: 141 meq/L (ref 135–145)
Total Bilirubin: 0.5 mg/dL (ref 0.2–1.2)
Total Protein: 8.1 g/dL (ref 6.0–8.3)

## 2024-01-07 LAB — CBC WITH DIFFERENTIAL/PLATELET
Basophils Absolute: 0 K/uL (ref 0.0–0.1)
Basophils Relative: 0.6 % (ref 0.0–3.0)
Eosinophils Absolute: 0.2 K/uL (ref 0.0–0.7)
Eosinophils Relative: 2.9 % (ref 0.0–5.0)
HCT: 40.3 % (ref 36.0–46.0)
Hemoglobin: 12.7 g/dL (ref 12.0–15.0)
Lymphocytes Relative: 46 % (ref 12.0–46.0)
Lymphs Abs: 2.8 K/uL (ref 0.7–4.0)
MCHC: 31.6 g/dL (ref 30.0–36.0)
MCV: 80 fl (ref 78.0–100.0)
Monocytes Absolute: 0.4 K/uL (ref 0.1–1.0)
Monocytes Relative: 6.1 % (ref 3.0–12.0)
Neutro Abs: 2.7 K/uL (ref 1.4–7.7)
Neutrophils Relative %: 44.4 % (ref 43.0–77.0)
Platelets: 267 K/uL (ref 150.0–400.0)
RBC: 5.03 Mil/uL (ref 3.87–5.11)
RDW: 13.9 % (ref 11.5–15.5)
WBC: 6.2 K/uL (ref 4.0–10.5)

## 2024-01-07 LAB — TSH: TSH: 1.07 u[IU]/mL (ref 0.35–5.50)

## 2024-01-07 NOTE — Patient Instructions (Addendum)
 Take Miralax 1 capful mixed in 8 ounces of water at bed time for constipation as tolerated. May increase Miralax to twice daily as needed  Complete the SIBO breath test in 4 weeks if constipation improved and if abdominal bloat persists   Contact our office if black stools recur   Your provider has requested that you go to the basement level for lab work before leaving today. Press B on the elevator. The lab is located at the first door on the left as you exit the elevator.   You have been given a testing kit to check for small intestine bacterial overgrowth (SIBO) which is completed by a company named [facility]. Make sure to return your test in the mail using the return mailing label given to you along with the kit. The test order, your demographic and insurance information have all already been sent to the company. [facility] will collect an upfront charge of $109.00 for commercial insurance plans and $229.00 if you are paying cash. The potential remaining total after claim submission and review is $120.00. Make sure to discuss with [facility] PRIOR to having the test to see if they have gotten information from your insurance company as to how much your testing will cost out of pocket, if any.  Please contact [facility] at phone number [phone] to get instructions regarding how to perform the test as our office is unable to give specific testing instructions.   _______________________________________________________  If your blood pressure at your visit was 140/90 or greater, please contact your primary care physician to follow up on this.  _______________________________________________________  If you are age 61 or older, your body mass index should be between 23-30. Your Body mass index is 29.79 kg/m. If this is out of the aforementioned range listed, please consider follow up with your Primary Care Provider.  If you are age 58 or younger, your body mass index  should be between 19-25. Your Body mass index is 29.79 kg/m. If this is out of the aformentioned range listed, please consider follow up with your Primary Care Provider.   ________________________________________________________  The [facility] providers would like to encourage you to use MYCHART to communicate with providers for non-urgent requests or questions.  Due to long hold times on the telephone, sending your provider a message by [facility] may be a faster and more efficient way to get a response.  Please allow 48 business hours for a response.  Please remember that this is for non-urgent requests.  _______________________________________________________  [facility] is using a team-based approach to care.  Your team is made up of your doctor and two to three APPS. Our APPS (Nurse Practitioners and Physician Assistants) work with your physician to ensure care continuity for you. They are fully qualified to address your health concerns and develop a treatment plan. They communicate directly with your gastroenterologist to care for you. Seeing the Advanced Practice Practitioners on your physician's team can help you by facilitating care more promptly, often allowing for earlier appointments, access to diagnostic testing, procedures, and other specialty referrals.

## 2024-01-21 ENCOUNTER — Ambulatory Visit

## 2024-01-21 ENCOUNTER — Ambulatory Visit
Admission: RE | Admit: 2024-01-21 | Discharge: 2024-01-21 | Disposition: A | Discharge status: Home / Self Care | Source: Ambulatory Visit | Attending: Internal Medicine | Admitting: Internal Medicine

## 2024-01-21 DIAGNOSIS — N644 Mastodynia: Secondary | ICD-10-CM

## 2024-02-08 ENCOUNTER — Telehealth: Payer: Self-pay | Admitting: Nurse Practitioner

## 2024-02-08 NOTE — Telephone Encounter (Signed)
 Inbound  call from the [place] in regards to this patient stating that the lab who she saw was trying to charge her for some lab work she received. [facility] does not understand why when she was approved for all testing under [place]. [facility] stated that we would need to reach out to the [place] at [phone] ext: [phone]. Please advise.

## 2024-02-08 NOTE — Telephone Encounter (Signed)
 Looks like SIBO testing was ordered on patient.

## 2024-02-11 NOTE — Telephone Encounter (Signed)
 Spoke with the [place] and they mentioned it could be the SIBO test the pt is referring to. I left a VM for the pt with [facility] phone number to call and ask about insurance coverage. If its about blood work then I left our office number to call us  back.

## 2024-02-17 NOTE — Telephone Encounter (Signed)
 Spoke with Martha [name] at [facility]. She states they would need the [place] referral/prior auth faxed to them. She also mentioned there is no guarantee the test will be completely covered. The patient may still have to pay a remaining balance. Pt informed of information via VM.

## 2024-03-10 ENCOUNTER — Telehealth: Payer: Self-pay | Admitting: Nurse Practitioner

## 2024-03-10 NOTE — Telephone Encounter (Signed)
 Alan, please contact the patient and let her know her SIBO breath test 02/23/2024 resulted negative, small bacterial overgrowth not suspected.  Thank you

## 2024-03-11 NOTE — Telephone Encounter (Signed)
 Called patient and informed her that her SIBO breath test is negative. Patient verbalized understanding.
